# Patient Record
Sex: Female | Born: 1985 | Race: White | Hispanic: No | Marital: Married | Smoking: Never smoker
Health system: Southern US, Community
[De-identification: ages and names within clinical notes are randomized; demographics above are authoritative.]

## PROBLEM LIST (undated history)

## (undated) DIAGNOSIS — L509 Urticaria, unspecified: Secondary | ICD-10-CM

## (undated) DIAGNOSIS — J302 Other seasonal allergic rhinitis: Secondary | ICD-10-CM

## (undated) DIAGNOSIS — R51 Headache: Secondary | ICD-10-CM

## (undated) DIAGNOSIS — R519 Headache, unspecified: Secondary | ICD-10-CM

## (undated) DIAGNOSIS — Z8619 Personal history of other infectious and parasitic diseases: Secondary | ICD-10-CM

## (undated) DIAGNOSIS — T7840XA Allergy, unspecified, initial encounter: Secondary | ICD-10-CM

## (undated) DIAGNOSIS — Z9289 Personal history of other medical treatment: Secondary | ICD-10-CM

## (undated) DIAGNOSIS — N926 Irregular menstruation, unspecified: Secondary | ICD-10-CM

## (undated) DIAGNOSIS — N390 Urinary tract infection, site not specified: Secondary | ICD-10-CM

## (undated) DIAGNOSIS — L309 Dermatitis, unspecified: Secondary | ICD-10-CM

## (undated) DIAGNOSIS — R55 Syncope and collapse: Secondary | ICD-10-CM

## (undated) DIAGNOSIS — R002 Palpitations: Secondary | ICD-10-CM

## (undated) DIAGNOSIS — G43909 Migraine, unspecified, not intractable, without status migrainosus: Secondary | ICD-10-CM

## (undated) HISTORY — DX: Migraine, unspecified, not intractable, without status migrainosus: G43.909

## (undated) HISTORY — DX: Urinary tract infection, site not specified: N39.0

## (undated) HISTORY — DX: Syncope and collapse: R55

## (undated) HISTORY — DX: Personal history of other medical treatment: Z92.89

## (undated) HISTORY — DX: Other seasonal allergic rhinitis: J30.2

## (undated) HISTORY — DX: Dermatitis, unspecified: L30.9

## (undated) HISTORY — DX: Headache: R51

## (undated) HISTORY — DX: Headache, unspecified: R51.9

## (undated) HISTORY — DX: Irregular menstruation, unspecified: N92.6

## (undated) HISTORY — DX: Personal history of other infectious and parasitic diseases: Z86.19

## (undated) HISTORY — PX: TYMPANOSTOMY TUBE PLACEMENT: SHX32

## (undated) HISTORY — DX: Allergy, unspecified, initial encounter: T78.40XA

## (undated) HISTORY — DX: Urticaria, unspecified: L50.9

## (undated) HISTORY — PX: TONSILECTOMY, ADENOIDECTOMY, BILATERAL MYRINGOTOMY AND TUBES: SHX2538

## (undated) HISTORY — DX: Palpitations: R00.2

## (undated) HISTORY — PX: ADENOIDECTOMY: SUR15

---
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
## 2008-03-03 ENCOUNTER — Other Ambulatory Visit: Payer: Self-pay

## 2008-03-03 ENCOUNTER — Emergency Department: Payer: Self-pay | Admitting: Unknown Physician Specialty

## 2008-03-13 ENCOUNTER — Ambulatory Visit: Payer: Self-pay | Admitting: Otolaryngology

## 2008-08-25 ENCOUNTER — Ambulatory Visit: Payer: Self-pay | Admitting: Otolaryngology

## 2008-10-21 ENCOUNTER — Ambulatory Visit: Payer: Self-pay | Admitting: Family

## 2008-12-02 ENCOUNTER — Ambulatory Visit: Payer: Self-pay | Admitting: Gastroenterology

## 2010-09-02 LAB — HM HIV SCREENING LAB: HM HIV Screening: NEGATIVE

## 2010-09-03 LAB — CBC AND DIFFERENTIAL
HCT: 33 — AB (ref 36–46)
HEMOGLOBIN: 10.8 — AB (ref 12.0–16.0)
NEUTROS ABS: 7
PLATELETS: 229 (ref 150–399)
WBC: 9.3

## 2010-11-14 ENCOUNTER — Observation Stay: Payer: Self-pay

## 2010-11-15 ENCOUNTER — Inpatient Hospital Stay: Payer: Self-pay | Admitting: Obstetrics & Gynecology

## 2013-05-28 ENCOUNTER — Encounter: Payer: Self-pay | Admitting: Internal Medicine

## 2013-05-28 ENCOUNTER — Ambulatory Visit (INDEPENDENT_AMBULATORY_CARE_PROVIDER_SITE_OTHER): Payer: BC Managed Care – PPO | Admitting: Internal Medicine

## 2013-05-28 VITALS — BP 108/74 | HR 75 | Temp 98.6°F | Resp 14 | Ht 64.14 in | Wt 172.5 lb

## 2013-05-28 DIAGNOSIS — R002 Palpitations: Secondary | ICD-10-CM

## 2013-05-28 DIAGNOSIS — R079 Chest pain, unspecified: Secondary | ICD-10-CM | POA: Insufficient documentation

## 2013-05-28 DIAGNOSIS — Z23 Encounter for immunization: Secondary | ICD-10-CM

## 2013-05-28 DIAGNOSIS — R0600 Dyspnea, unspecified: Secondary | ICD-10-CM

## 2013-05-28 DIAGNOSIS — R0609 Other forms of dyspnea: Secondary | ICD-10-CM

## 2013-05-28 LAB — BASIC METABOLIC PANEL
BUN: 11 mg/dL (ref 6–23)
CO2: 25 mEq/L (ref 19–32)
Calcium: 9.4 mg/dL (ref 8.4–10.5)
Chloride: 107 mEq/L (ref 96–112)
GFR: 119.7 mL/min (ref >60.00)
Glucose, Bld: 93 mg/dL (ref 70–99)
Potassium: 4.3 mEq/L (ref 3.5–5.1)
Sodium: 141 mEq/L (ref 135–145)

## 2013-05-28 LAB — HEPATIC FUNCTION PANEL
ALT: 14 U/L (ref 0–35)
AST: 16 U/L (ref 0–37)
Albumin: 4.4 g/dL (ref 3.5–5.2)
Alkaline Phosphatase: 41 U/L (ref 39–117)
Bilirubin, Direct: 0.1 mg/dL (ref 0.0–0.3)
Total Protein: 7.7 g/dL (ref 6.0–8.3)

## 2013-05-28 LAB — LDL CHOLESTEROL, DIRECT: Direct LDL: 95.4 mg/dL

## 2013-05-28 MED ORDER — ESOMEPRAZOLE MAGNESIUM 20 MG PO CPDR: 20.0000 mg | DELAYED_RELEASE_CAPSULE | Freq: Every day | ORAL | Status: DC

## 2013-05-28 NOTE — Progress Notes (Signed)
Patient ID: Brooke Mclaughlin, female   DOB: 24-Jan-1986, 27 y.o.   MRN: [id number]  Patient Active Problem List   Diagnosis Date Noted  . Chest pain 05/28/2013    Subjective:  CC:   Chief Complaint  Patient presents with  . Establish Care    HPI:   Brooke Mclaughlin is a 27 y.o. female who presents as a new patient to establish primary care with the chief complaint of  Recurrent chest pain.  Most recent epiosde lasted an entire week and occurred a month ago ,  Felt constant,  Didn't feel like reflux"I've had that before"  Had an episode of left arm tingling for a few hours during the last episode,  And had an episode of nausea that lasted a few hours. Sometimes the episodes start with a headache, followed by chest pain and palpitations. 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  Always at rest or doing nonexertional activities. Gets diffuse pain across the chest wall,  Then gets Short of breath, describes it as an uncomfortable pressure and heaviness.  Accompanied  By palpitations. Which have been occurring for years.  first episode occurred 1  To 2 years ago,  No prior evaluation but has seen a cardiologist in the remote past for dizziness, at [facility] at age 41.  A stress test was done (treadmill) but she had no recurrence of symptoms while on it.  Marland Kitchenalso saw ENT for dizziness but no cause was found She has  been on birth control since the birth of her son , does not smoke and has no personal or family history of thrombotic disorders but there is a strong maternal history of CAD;  reportedly her MGM had an AMI in her 41's andher maternal aunt has also had an MI.  MGM is currently living with a defibrillator.    Referred by mother Brooke Mclaughlin.   Has had reflux but states that it occurs rarely and did not feel at all like the pain she is describing  Occasional migraines, occur once a month .   Starts with an aura folowed by headache and nausea.  One time had complete visual loss occurred while driving  No history of hospitalization .,  Vaginal delivery 2 years ago, no complications of pregnancy or delivert  History of syncope at age 41 while at work.   Lost 30 lbs post partum . Eats 6 small meals daily.  Drinks diet sodas and water .  No history of snoring,   No tobacco or illicits.  Moderate consumption of beer., mostly on the weekends. Lives alone with her  2 yr old son.  No history of domestic violence.      Past Medical History  Diagnosis Date  . Syncope 2003    has felt that way since but did not faint  . Migraines   . Frequent headaches   . UTI (lower urinary tract infection)     Past Surgical History  Procedure Laterality Date  . Tonsilectomy, adenoidectomy, bilateral myringotomy and tubes Bilateral     Family History  Problem Relation Age of Onset  . Hypertension Mother   . Hypertension Maternal Grandmother   . Heart disease Maternal Grandmother     History   Social History  . Marital Status: Single    Spouse Name: N/A    Number of Children: N/A  . Years of Education: N/A  Occupational History  . Not on file.   Social History Main Topics  . Smoking status: Never Smoker   . Smokeless tobacco: Never Used  . Alcohol Use: Yes  . Drug Use: No  . Sexual Activity: Yes     Comment: Not at the moment   Other Topics Concern  . Not on file   Social History Narrative  . No narrative on file   No Known Allergies   Review of Systems:   The remainder of the review of systems was negative except those addressed in the HPI.    Objective:  BP 108/74  Pulse 75  Temp(Src) 98.6 F (37 C) (Oral)  Resp 14  Ht 5' 4.14" (1.629 m)  Wt 172 lb 8 oz (78.245 kg)  BMI 29.49 kg/m2  SpO2 99%  LMP 03/04/2013  General appearance: alert, cooperative and appears stated age Ears: normal TM's and external ear canals both ears Throat: lips, mucosa, and  tongue normal; teeth and gums normal Neck: no adenopathy, no carotid bruit, supple, symmetrical, trachea midline and thyroid not enlarged, symmetric, no tenderness/mass/nodules Back: symmetric, no curvature. ROM normal. No CVA tenderness. Lungs: clear to auscultation bilaterally Heart: regular rate and rhythm, S1, S2 normal, no murmur, click, rub or gallop Abdomen: soft, non-tender; bowel sounds normal; no masses,  no organomegaly Pulses: 2+ and symmetric Skin: Skin color, texture, turgor normal. No rashes or lesions Lymph nodes: Cervical, supraclavicular, and axillary nodes normal.  Assessment and Plan:  Chest pain Etiology unclear,  Her pain is rcurrent, atypical with last episode lasting a  week  accompanied by palpitations and dyspnea. Prior cardiac workup for dizziness 4 years ago included a stress test which was normal Gavin Potters, presumed).  Given her history of birth control use (Implanon) I have checked a D Dmer which was normal at 0.27 so a PR is unlikely.  Baseline EKG is done and WNL.    cardiology referral to Dossie Arbour for Holter monitor and ECHO.  Will treat empirically for esohagitis /reflux in the interim with a PPI.    Updated Medication List Outpatient Encounter Prescriptions as of 05/28/2013  Medication Sig Dispense Refill  . esomeprazole (NEXIUM) 20 MG capsule Take 1 capsule (20 mg total) by mouth daily before breakfast.  15 capsule  0  . Etonogestrel (IMPLANON Roosevelt) Inject into the skin.       No facility-administered encounter medications on file as of 05/28/2013.     Orders Placed This Encounter  Procedures  . Flu Vaccine QUAD 36+ mos PF IM (Fluarix)  . Basic metabolic panel  . Hepatic function panel  . LDL cholesterol, direct  . TSH  . Magnesium  . D-Dimer, Quantitative  . Ambulatory referral to Cardiology  . POCT urine pregnancy  . EKG 12-Lead    No Follow-up on file.

## 2013-05-28 NOTE — Assessment & Plan Note (Addendum)
Etiology unclear,  Her pain is rcurrent, atypical with last episode lasting a  week  accompanied by palpitations and dyspnea. Prior cardiac workup for dizziness 4 years ago included a stress test which was normal Brooke Mclaughlin, presumed).  Given her history of birth control use (Implanon) I have checked a D Dmer which was normal at 0.27 so a PR is unlikely.  Baseline EKG is done and WNL.    cardiology referral to Brooke Mclaughlin for Holter monitor and ECHO.  Will treat empirically for esohagitis /reflux in the interim with a PPI.

## 2013-05-28 NOTE — Patient Instructions (Addendum)
I am checking labs today that will help me decide if you need to have the CT of the chest   If the blood work is normal you will not need to have the Ct and we will send you to the cardiologist   In the meantime, I want you to take a Nexium tablet daily in the morning at least 20 minutes  before you eat. This treats reflux

## 2013-06-02 ENCOUNTER — Ambulatory Visit: Payer: BC Managed Care – PPO | Admitting: Cardiovascular Disease

## 2013-06-09 ENCOUNTER — Ambulatory Visit: Payer: BC Managed Care – PPO | Admitting: Cardiovascular Disease

## 2014-10-20 ENCOUNTER — Ambulatory Visit: Payer: Self-pay | Admitting: Nurse Practitioner

## 2014-10-21 ENCOUNTER — Ambulatory Visit (INDEPENDENT_AMBULATORY_CARE_PROVIDER_SITE_OTHER): Payer: BLUE CROSS/BLUE SHIELD | Admitting: Nurse Practitioner

## 2014-10-21 ENCOUNTER — Encounter: Payer: Self-pay | Admitting: Nurse Practitioner

## 2014-10-21 VITALS — BP 124/78 | HR 82 | Temp 98.2°F | Resp 12 | Ht 64.0 in | Wt 191.4 lb

## 2014-10-21 DIAGNOSIS — R2242 Localized swelling, mass and lump, left lower limb: Secondary | ICD-10-CM

## 2014-10-21 DIAGNOSIS — H9201 Otalgia, right ear: Secondary | ICD-10-CM

## 2014-10-21 DIAGNOSIS — R21 Rash and other nonspecific skin eruption: Secondary | ICD-10-CM

## 2014-10-21 DIAGNOSIS — R222 Localized swelling, mass and lump, trunk: Secondary | ICD-10-CM

## 2014-10-21 NOTE — Assessment & Plan Note (Addendum)
Underneath scar she reports pain. Will get US of that area to check for further disturbances. May need to see Dermatologist again for check up of moles.

## 2014-10-21 NOTE — Progress Notes (Signed)
Subjective:    Patient ID: Brooke GlassmanAmy C Mclaughlin, female    DOB: 1986-03-08, 29 y.o.   MRN: [id number]  HPI  Brooke Mclaughlin is a 29 yo female with Multiple CC.   1) Lump on Left thigh- 1 cm, noticed couple years ago, tender when palpated, denies color changes, or growth   2) Pain lower back, mole removed x 8 years ago. Reports it was pre-cancerous. It was removed by ENT. Tanning beds years ago when teenager, uses sunscreen most of the time.   3) Rash- right lower forearm, burns, x 2 weeks, not tried anything.   4) Right ear pain x 1 day and sore throat x 1 week. Yesterday, improved today. Reports allergies.   Review of Systems  Constitutional: Negative for fever, chills, diaphoresis and fatigue.  HENT: Positive for ear pain.   Respiratory: Negative for chest tightness, shortness of breath and wheezing.   Cardiovascular: Negative for chest pain, palpitations and leg swelling.  Gastrointestinal: Negative for nausea, vomiting and diarrhea.  Musculoskeletal: Positive for back pain.  Skin: Positive for rash.  Neurological: Negative for dizziness, weakness, numbness and headaches.  Psychiatric/Behavioral: The patient is not nervous/anxious.    Past Medical History  Diagnosis Date  . Syncope 2003    has felt that way since but did not faint  . Migraines   . Frequent headaches   . UTI (lower urinary tract infection)     History   Social History  . Marital Status: Single    Spouse Name: N/A  . Number of Children: N/A  . Years of Education: N/A   Occupational History  . Not on file.   Social History Main Topics  . Smoking status: Never Smoker   . Smokeless tobacco: Never Used  . Alcohol Use: Yes  . Drug Use: No  . Sexual Activity: Yes     Comment: Not at the moment   Other Topics Concern  . Not on file   Social History Narrative    Past Surgical History  Procedure Laterality Date  . Tonsilectomy, adenoidectomy, bilateral myringotomy and tubes Bilateral     Family  History  Problem Relation Age of Onset  . Hypertension Mother   . Hypertension Maternal Grandmother   . Heart disease Maternal Grandmother     No Known Allergies  Current Outpatient Prescriptions on File Prior to Visit  Medication Sig Dispense Refill  . Etonogestrel (IMPLANON [place]ou[place]) Inject into the skin.     No current facility-administered medications on file prior to visit.      Objective:   Physical Exam  Constitutional: She is oriented to person, place, and time. She appears well-developed and well-nourished. No distress.  BP 124/78 mmHg  Pulse 82  Temp(Src) 98.2 F (36.8 C) (Oral)  Resp 12  Ht 5\' 4"  (1.626 m)  Wt 191 lb 6.4 oz (86.818 kg)  BMI 32.84 kg/m2  SpO2 97%  LMP  (Exact Date)   HENT:  Head: Normocephalic and atraumatic.  Right Ear: External ear normal.  Left Ear: External ear normal.  TM's Clear bilaterally. Red area that is irritated Cymba of the right ear- looks like there are several black heads and may be clogged.   Cardiovascular: Normal rate, regular rhythm, normal heart sounds and intact distal pulses.  Exam reveals no gallop and no friction rub.   No murmur heard. Pulmonary/Chest: Effort normal and breath sounds normal. No respiratory distress. She has no wheezes. She has no rales. She exhibits no tenderness.  Neurological:  She is alert and oriented to person, place, and time. No cranial nerve deficit. She exhibits normal muscle tone. Coordination normal.  Skin: Skin is warm and dry. No rash noted. She is not diaphoretic.     Psychiatric: She has a normal mood and affect. Her behavior is normal. Judgment and thought content normal.       Assessment & Plan:

## 2014-10-21 NOTE — Progress Notes (Signed)
Pre visit review using our clinic review tool, if applicable. No additional management support is needed unless otherwise documented below in the visit note. 

## 2014-10-21 NOTE — Patient Instructions (Addendum)
We will contact you about your referral for ultrasound.   Over the counter cortisone cream for rash, keep open to air  It is recommended to have yearly checks of your moles by a dermatologist. Remember to use sunscreen when outdoors (sunny or overcast) and hats or clothing for further protection.   Alcohol on a swab for outer ear pain.   Keep an eye on your sore throat.   Call us is anything changes or worsens.

## 2014-10-24 DIAGNOSIS — H9201 Otalgia, right ear: Secondary | ICD-10-CM | POA: Insufficient documentation

## 2014-10-24 DIAGNOSIS — R2240 Localized swelling, mass and lump, unspecified lower limb: Secondary | ICD-10-CM | POA: Insufficient documentation

## 2014-10-24 NOTE — Assessment & Plan Note (Signed)
Can not see a rash, feels like dry skin. Asked her to try OTC cortisone if itching. Will follow.

## 2014-10-24 NOTE — Assessment & Plan Note (Signed)
Ear pain is external. Asked pt to try dipping q-tip in alcohol and cleaning the external ear where there are several comedones.

## 2014-10-24 NOTE — Assessment & Plan Note (Signed)
US of thigh lump since it is tender and rubbery

## 2014-10-27 ENCOUNTER — Ambulatory Visit: Payer: Self-pay | Admitting: Nurse Practitioner

## 2014-11-24 ENCOUNTER — Telehealth: Payer: Self-pay | Admitting: Internal Medicine

## 2014-11-24 NOTE — Telephone Encounter (Signed)
US requested.  Will be faxed asap.

## 2014-11-24 NOTE — Telephone Encounter (Signed)
Patient would like the results of the ultrasound that she took.  Please call her at work [phone] Ext. [phone].

## 2014-11-24 NOTE — Telephone Encounter (Signed)
Please request results if we do not have.

## 2014-11-24 NOTE — Telephone Encounter (Signed)
ordered by Lyla Sonarrie,  Please check with Lyla Sonarrie

## 2014-11-25 ENCOUNTER — Telehealth: Payer: Self-pay

## 2014-11-25 ENCOUNTER — Telehealth: Payer: Self-pay | Admitting: Internal Medicine

## 2014-11-25 NOTE — Telephone Encounter (Signed)
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

## 2014-11-25 NOTE — Telephone Encounter (Signed)
Ultrasound results read by Dr. Darrick Huntsmanullo.  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  Patient verbalized understanding and stated she was still having pain at the area of the scar.  I told patient if concerns remain a MRI can be obtained.  Encouraged pt to call back with any questions or concerns.

## 2014-11-26 NOTE — Telephone Encounter (Signed)
Notified patient.  Patient verbalized understanding and was encouraged to call back if there were further concerns wanted to proceed with a MRI.

## 2015-01-01 ENCOUNTER — Encounter: Payer: Self-pay | Admitting: Internal Medicine

## 2015-11-23 ENCOUNTER — Telehealth: Payer: Self-pay | Admitting: Internal Medicine

## 2015-11-23 MED ORDER — VALACYCLOVIR HCL 500 MG PO TABS: 500.0000 mg | ORAL_TABLET | Freq: Two times a day (BID) | ORAL | Status: DC

## 2015-11-23 NOTE — Telephone Encounter (Signed)
Yes, will send ,  [facility] since I have not seen her since 2014 and carrie has seen her since then.  She is no longer considered my patient

## 2015-11-23 NOTE — Telephone Encounter (Signed)
Thanks, I will next time! :)

## 2015-11-23 NOTE — Telephone Encounter (Signed)
Pt called wanting to know if Dr Darrick Huntsmanullo will write a Rx for pt, pt states she get a lot of cold sores on lip. Medication name is Zalacyclovir 500 mg. [facility]. Pharmacy [place] [id number] [place] - [address] Call pt @ [phone].  Pt states please leave message if she does not answer. Thank you!

## 2015-11-23 NOTE — Telephone Encounter (Signed)
Patient's last OV was Last march, please advise if you will write script. Thanks.

## 2015-12-17 ENCOUNTER — Other Ambulatory Visit: Payer: Self-pay | Admitting: Family Medicine

## 2015-12-17 ENCOUNTER — Telehealth: Payer: Self-pay | Admitting: Internal Medicine

## 2015-12-17 DIAGNOSIS — M25532 Pain in left wrist: Secondary | ICD-10-CM

## 2015-12-17 NOTE — Telephone Encounter (Signed)
Pt called in about left wrist it's been burning and throbbing pain cannot move it in certain directions. Pt fell Saturday on her wrist. Pt did not go to the urgent care. The night she fell on it she had tingling all the way up her arm no bruises. Pt called to get some direction on what to do. Call pt @ [phone]. I advised pt if she wants to make an appt she said she wants to wait to see what the nurse says. Thank you!

## 2015-12-19 NOTE — Telephone Encounter (Signed)
X-ray ordered.

## 2015-12-20 NOTE — Telephone Encounter (Signed)
FYI

## 2015-12-20 NOTE — Addendum Note (Signed)
Addended by: Sherlene ShamsULLO, Cherylynn Liszewski L on: 12/20/2015 11:49 AM   Modules accepted: Orders

## 2016-05-01 LAB — HM PAP SMEAR

## 2016-10-19 ENCOUNTER — Ambulatory Visit: Payer: BLUE CROSS/BLUE SHIELD | Admitting: Family Medicine

## 2017-01-24 ENCOUNTER — Ambulatory Visit (INDEPENDENT_AMBULATORY_CARE_PROVIDER_SITE_OTHER): Payer: BLUE CROSS/BLUE SHIELD | Admitting: Obstetrics & Gynecology

## 2017-01-24 ENCOUNTER — Encounter: Payer: Self-pay | Admitting: Obstetrics & Gynecology

## 2017-01-24 VITALS — BP 122/80 | HR 77 | Ht 64.0 in | Wt 209.0 lb

## 2017-01-24 DIAGNOSIS — Z3046 Encounter for surveillance of implantable subdermal contraceptive: Secondary | ICD-10-CM | POA: Diagnosis not present

## 2017-01-24 NOTE — Progress Notes (Signed)
   Contraception Counseling Patient presents for contraception counseling. The patient has no complaints today. The patient is sexually active. Pertinent past medical history: none.  Desires Nexplanon out.  No further BC desired at this time.  Wants to see how periods do without hormones.  OK if got pregnant.  PMHx: She  has a past medical history of Frequent headaches; Migraines; Syncope (2003); and UTI (lower urinary tract infection). Also,  has a past surgical history that includes Tonsilectomy, adenoidectomy, bilateral myringotomy and tubes (Bilateral)., family history includes Heart disease in her maternal grandmother; Hypertension in her maternal grandmother and mother.,  reports that she has never smoked. She has never used smokeless tobacco. She reports that she drinks alcohol. She reports that she does not use drugs.  She has a current medication list which includes the following prescription(s): valacyclovir. Also, has No Known Allergies.  Review of Systems  Constitutional: Negative for chills, fever and malaise/fatigue.  HENT: Negative for congestion, sinus pain and sore throat.   Eyes: Negative for blurred vision and pain.  Respiratory: Negative for cough and wheezing.   Cardiovascular: Negative for chest pain and leg swelling.  Gastrointestinal: Negative for abdominal pain, constipation, diarrhea, heartburn, nausea and vomiting.  Genitourinary: Negative for dysuria, frequency, hematuria and urgency.  Musculoskeletal: Negative for back pain, joint pain, myalgias and neck pain.  Skin: Negative for itching and rash.  Neurological: Negative for dizziness, tremors and weakness.  Endo/Heme/Allergies: Does not bruise/bleed easily.  Psychiatric/Behavioral: Negative for depression. The patient is not nervous/anxious and does not have insomnia.     Objective: BP 122/80   Pulse 77   Ht 5\' 4"  (1.626 m)   Wt 209 lb (94.8 kg)   LMP 12/30/2016   BMI 35.87 kg/m  Physical Exam    Constitutional: She is oriented to person, place, and time. She appears well-developed and well-nourished. No distress.  Musculoskeletal: Normal range of motion.  Neurological: She is alert and oriented to person, place, and time.  Skin: Skin is warm and dry.  Psychiatric: She has a normal mood and affect.  Vitals reviewed.   ASSESSMENT/PLAN:    Problem List Items Addressed This Visit      Other   Encounter for surveillance of implantable subdermal contraceptive - Primary      Nexplanon removal Procedure note - The Nexplanon was noted in the patient's arm and the end was identified. The skin was cleansed with a Betadine solution. A small injection of subcutaneous lidocaine with epinephrine was given over the end of the implant. An incision was made at the end of the implant. The rod was noted in the incision and grasped with a hemostat. It was noted to be intact.  Steri-Strip was placed approximating the incision. Hemostasis was noted.  Letitia Libraobert Paul Zeynep Fantroy ,MD 01/24/2017,3:40 PM

## 2017-03-06 ENCOUNTER — Ambulatory Visit (INDEPENDENT_AMBULATORY_CARE_PROVIDER_SITE_OTHER): Payer: BLUE CROSS/BLUE SHIELD | Admitting: Obstetrics and Gynecology

## 2017-03-06 ENCOUNTER — Encounter: Payer: Self-pay | Admitting: Obstetrics and Gynecology

## 2017-03-06 ENCOUNTER — Other Ambulatory Visit (INDEPENDENT_AMBULATORY_CARE_PROVIDER_SITE_OTHER): Payer: BLUE CROSS/BLUE SHIELD

## 2017-03-06 VITALS — BP 130/82 | HR 94 | Ht 64.0 in | Wt 213.0 lb

## 2017-03-06 DIAGNOSIS — R1032 Left lower quadrant pain: Secondary | ICD-10-CM

## 2017-03-06 DIAGNOSIS — N83201 Unspecified ovarian cyst, right side: Secondary | ICD-10-CM | POA: Diagnosis not present

## 2017-03-06 NOTE — Progress Notes (Signed)
Chief Complaint  Patient presents with  . Pelvic Pain    radiates to back x 1 week/ Nexplanon removed in June    HPI:      Ms. Brooke Mclaughlin is a 31 y.o. G1P1001 who LMP was Patient's last menstrual period was 01/30/2017., presents today for LLQ pain for the past wk. Sx are sharp, dull, achy and intermittent, 7/10 at their worst. Pain radiates to LT LBP and LT flank. She has tried ibup without relief. She went to urgent care last wk and had WBCs in her urine and was given abx, without any pain releif. She didn't feel like she had a UTI. No vag sx, no GI sx except nausea with the pain, no fevers. She has a hx of ovar cysts in the past. She had nexplanon removed 6/18 and conception is ok. She has not had her period since nexplanon rem but had neg UPT at urgent care last wk.  She is sex active, no new sex partners.    Patient Active Problem List   Diagnosis Date Noted  . Right ovarian cyst 03/06/2017  . Encounter for surveillance of implantable subdermal contraceptive 01/24/2017  . Lump of thigh 10/24/2014  . Right ear pain 10/24/2014  . Rash and nonspecific skin eruption 10/21/2014  . Lump of skin of back 10/21/2014  . Chest pain 05/28/2013    Family History  Problem Relation Age of Onset  . Hypertension Mother        RUNS ON MAT SIDE OF FAMILY  . Migraines Father   . Hypertension Maternal Grandmother   . Heart disease Maternal Grandmother   . Heart disease Maternal Aunt   . Heart disease Maternal Uncle     Social History   Social History  . Marital status: Single    Spouse name: N/A  . Number of children: 1  . Years of education: 30   Occupational History  . ENGINEER     [employer]   Social History Main Topics  . Smoking status: Never Smoker  . Smokeless tobacco: Never Used  . Alcohol use Yes     Comment: OCC  . Drug use: No  . Sexual activity: Yes     Comment: Not at the moment   Other Topics Concern  . Not on file   Social History Narrative  . No  narrative on file     Current Outpatient Prescriptions:  .  valACYclovir (VALTREX) 500 MG tablet, Take 1 tablet (500 mg total) by mouth 2 (two) times daily., Disp: 30 tablet, Rfl: 3  Review of Systems  Constitutional: Negative for fever.  Gastrointestinal: Positive for nausea. Negative for blood in stool, constipation, diarrhea and vomiting.  Genitourinary: Positive for pelvic pain. Negative for dyspareunia, dysuria, flank pain, frequency, hematuria, urgency, vaginal bleeding, vaginal discharge and vaginal pain.  Musculoskeletal: Negative for back pain.  Skin: Negative for rash.     OBJECTIVE:   Vitals:  BP 130/82 (BP Location: Left Arm, Patient Position: Sitting, Cuff Size: Large)   Pulse 94   Ht 5\' 4"  (1.626 m)   Wt 213 lb (96.6 kg)   LMP 01/30/2017   BMI 36.56 kg/m   Physical Exam  Constitutional: She is oriented to person, place, and time and well-developed, well-nourished, and in no distress. Vital signs are normal.  Abdominal: There is tenderness in the left lower quadrant. There is no rigidity and no guarding.  Genitourinary: Vagina normal, cervix normal, right adnexa normal and vulva normal. Uterus is  tender. Uterus is not enlarged. Cervix exhibits no motion tenderness and no tenderness. Right adnexum displays no mass and no tenderness. Left adnexum displays tenderness. Left adnexum displays no mass. Vulva exhibits no erythema, no exudate, no lesion, no rash and no tenderness. Vagina exhibits no lesion.  Neurological: She is oriented to person, place, and time.  Vitals reviewed.   Results: GYN U/S-->EM=2.78 MM; LTO WNL; RTO WITH 5.8 CM X 3.3 CM SIMPLE CYST; NO FF IN CDS  Assessment/Plan: LLQ pain - RTO cyst on u/s. Question deferred pain.  - Plan: US Transvaginal Non-OB  Right ovarian cyst - 5.8 x 3.3 cm RTO cyst. Rechk u/s in 6 wks. NSAIDs. F/u sooner prn.  - Plan: US Transvaginal Non-OB   Return in about 6 weeks (around 04/17/2017) for GYN u/s for RTO cyst f/u--[facility]  to call pt.  Brooke Needs B. Damya Comley, PA-C 03/06/2017 1:55 PM

## 2017-03-26 ENCOUNTER — Encounter: Payer: Self-pay | Admitting: Obstetrics & Gynecology

## 2017-03-26 MED ORDER — NORETHIN-ETH ESTRAD-FE BIPHAS 1 MG-10 MCG / 10 MCG PO TABS: 1.0000 | ORAL_TABLET | Freq: Every day | ORAL | 3 refills | Status: DC

## 2017-03-26 NOTE — Telephone Encounter (Signed)
ERx done for OCP

## 2017-04-17 ENCOUNTER — Ambulatory Visit (INDEPENDENT_AMBULATORY_CARE_PROVIDER_SITE_OTHER): Payer: BLUE CROSS/BLUE SHIELD

## 2017-04-17 ENCOUNTER — Telehealth: Payer: Self-pay | Admitting: Obstetrics and Gynecology

## 2017-04-17 DIAGNOSIS — N83201 Unspecified ovarian cyst, right side: Secondary | ICD-10-CM | POA: Diagnosis not present

## 2017-04-17 NOTE — Telephone Encounter (Signed)
Pt aware of neg u/s, resolved RTO cyst. Pt's LLQ pain sx resolved. F/u prn.

## 2017-06-20 ENCOUNTER — Encounter: Payer: Self-pay | Admitting: Family Medicine

## 2017-06-20 ENCOUNTER — Ambulatory Visit (INDEPENDENT_AMBULATORY_CARE_PROVIDER_SITE_OTHER): Payer: BLUE CROSS/BLUE SHIELD | Admitting: Family Medicine

## 2017-06-20 VITALS — BP 118/80 | HR 73 | Temp 98.0°F | Wt 209.5 lb

## 2017-06-20 DIAGNOSIS — G4452 New daily persistent headache (NDPH): Secondary | ICD-10-CM

## 2017-06-20 DIAGNOSIS — E559 Vitamin D deficiency, unspecified: Secondary | ICD-10-CM

## 2017-06-20 LAB — COMPREHENSIVE METABOLIC PANEL
ALBUMIN: 4 g/dL (ref 3.5–5.2)
ALK PHOS: 43 U/L (ref 39–117)
ALT: 12 U/L (ref 0–35)
AST: 12 U/L (ref 0–37)
BILIRUBIN TOTAL: 0.5 mg/dL (ref 0.2–1.2)
BUN: 12 mg/dL (ref 6–23)
CALCIUM: 9.2 mg/dL (ref 8.4–10.5)
CO2: 24 mEq/L (ref 19–32)
Chloride: 108 mEq/L (ref 96–112)
Creatinine, Ser: 0.69 mg/dL (ref 0.40–1.20)
GFR: 104.84 mL/min (ref >60.00)
Glucose, Bld: 96 mg/dL (ref 70–99)
Potassium: 4.2 mEq/L (ref 3.5–5.1)
Sodium: 138 mEq/L (ref 135–145)
TOTAL PROTEIN: 7.1 g/dL (ref 6.0–8.3)

## 2017-06-20 LAB — CBC
HEMATOCRIT: 40.9 % (ref 36.0–46.0)
HEMOGLOBIN: 13.6 g/dL (ref 12.0–15.0)
MCHC: 33.3 g/dL (ref 30.0–36.0)
MCV: 93.3 fl (ref 78.0–100.0)
Platelets: 267 10*3/uL (ref 150.0–400.0)
RBC: 4.39 Mil/uL (ref 3.87–5.11)
RDW: 12.6 % (ref 11.5–15.5)
WBC: 5.1 10*3/uL (ref 4.0–10.5)

## 2017-06-20 LAB — VITAMIN D 25 HYDROXY (VIT D DEFICIENCY, FRACTURES): VITD: 20.8 ng/mL — ABNORMAL LOW (ref 30.00–100.00)

## 2017-06-20 LAB — TSH: TSH: 1.49 u[IU]/mL (ref 0.35–4.50)

## 2017-06-20 LAB — MAGNESIUM: MAGNESIUM: 2.1 mg/dL (ref 1.5–2.5)

## 2017-06-20 LAB — VITAMIN B12: VITAMIN B 12: 128 pg/mL — AB (ref 211–911)

## 2017-06-20 MED ORDER — SUMATRIPTAN SUCCINATE 50 MG PO TABS: ORAL_TABLET | ORAL | 0 refills | Status: DC

## 2017-06-20 MED ORDER — KETOROLAC TROMETHAMINE 60 MG/2ML IM SOLN
60.0000 mg | Freq: Once | INTRAMUSCULAR | Status: AC
  Administered 2017-06-20: 60 mg via INTRAMUSCULAR

## 2017-06-20 MED ORDER — DICLOFENAC SODIUM 75 MG PO TBEC: 75.0000 mg | DELAYED_RELEASE_TABLET | Freq: Two times a day (BID) | ORAL | 0 refills | Status: DC

## 2017-06-20 NOTE — Patient Instructions (Signed)
I have sent in two medications to your pharmacy If not better with injection in office, you can take sumatriptan If using diclofenac, do not take other NSAIDs like ibuprofen, advil, alleve If not better in a couple of days, please let me know  Migraine Headache A migraine headache is an intense, throbbing pain on one side or both sides of the head. Migraines may also cause other symptoms, such as nausea, vomiting, and sensitivity to light and noise. What are the causes? Doing or taking certain things may also trigger migraines, such as:  Alcohol.  Smoking.  Medicines, such as: ? Medicine used to treat chest pain (nitroglycerine). ? Birth control pills. ? Estrogen pills. ? Certain blood pressure medicines.  Aged cheeses, chocolate, or caffeine.  Foods or drinks that contain nitrates, glutamate, aspartame, or tyramine.  Physical activity.  Other things that may trigger a migraine include:  Menstruation.  Pregnancy.  Hunger.  Stress, lack of sleep, too much sleep, or fatigue.  Weather changes.  What increases the risk? The following factors may make you more likely to experience migraine headaches:  Age. Risk increases with age.  Family history of migraine headaches.  Being Caucasian.  Depression and anxiety.  Obesity.  Being a woman.  Having a hole in the heart (patent foramen ovale) or other heart problems.  What are the signs or symptoms? The main symptom of this condition is pulsating or throbbing pain. Pain may:  Happen in any area of the head, such as on one side or both sides.  Interfere with daily activities.  Get worse with physical activity.  Get worse with exposure to bright lights or loud noises.  Other symptoms may include:  Nausea.  Vomiting.  Dizziness.  General sensitivity to bright lights, loud noises, or smells.  Before you get a migraine, you may get warning signs that a migraine is developing (aura). An aura may  include:  Seeing flashing lights or having blind spots.  Seeing bright spots, halos, or zigzag lines.  Having tunnel vision or blurred vision.  Having numbness or a tingling feeling.  Having trouble talking.  Having muscle weakness.  How is this diagnosed? A migraine headache can be diagnosed based on:  Your symptoms.  A physical exam.  Tests, such as CT scan or MRI of the head. These imaging tests can help rule out other causes of headaches.  Taking fluid from the spine (lumbar puncture) and analyzing it (cerebrospinal fluid analysis, or CSF analysis).  How is this treated? A migraine headache is usually treated with medicines that:  Relieve pain.  Relieve nausea.  Prevent migraines from coming back.  Treatment may also include:  Acupuncture.  Lifestyle changes like avoiding foods that trigger migraines.  Follow these instructions at home: Medicines  Take over-the-counter and prescription medicines only as told by your health care provider.  Do not drive or use heavy machinery while taking prescription pain medicine.  To prevent or treat constipation while you are taking prescription pain medicine, your health care provider may recommend that you: ? Drink enough fluid to keep your urine clear or pale yellow. ? Take over-the-counter or prescription medicines. ? Eat foods that are high in fiber, such as fresh fruits and vegetables, whole grains, and beans. ? Limit foods that are high in fat and processed sugars, such as fried and sweet foods. Lifestyle  Avoid alcohol use.  Do not use any products that contain nicotine or tobacco, such as cigarettes and e-cigarettes. If you need help  quitting, ask your health care provider.  Get at least 8 hours of sleep every night.  Limit your stress. General instructions   Keep a journal to find out what may trigger your migraine headaches. For example, write down: ? What you eat and drink. ? How much sleep you  get. ? Any change to your diet or medicines.  If you have a migraine: ? Avoid things that make your symptoms worse, such as bright lights. ? It may help to lie down in a dark, quiet room. ? Do not drive or use heavy machinery. ? Ask your health care provider what activities are safe for you while you are experiencing symptoms.  Keep all follow-up visits as told by your health care provider. This is important. Contact a health care provider if:  You develop symptoms that are different or more severe than your usual migraine symptoms. Get help right away if:  Your migraine becomes severe.  You have a fever.  You have a stiff neck.  You have vision loss.  Your muscles feel weak or like you cannot control them.  You start to lose your balance often.  You develop trouble walking.  You faint. This information is not intended to replace advice given to you by your health care provider. Make sure you discuss any questions you have with your health care provider. Document Released: 08/07/2005 Document Revised: 02/25/2016 Document Reviewed: 01/24/2016 Elsevier Interactive Patient Education  2017 [employer].

## 2017-06-20 NOTE — Progress Notes (Signed)
Subjective:    Patient ID: Brooke GlassmanAmy C Mclaughlin, female    DOB: 1985/10/21, 31 y.o.   MRN: [id number]  HPI This is a 31 yo female who presents today with headaches x 1.5 weeks. Headaches are pounding. Pain all the time. Worse as day goes on. Some nausea, no aura. Some light sensitivity, no sound sensitivity. Has tried ibuprofen, Excedrin, ASA, tylenol. Pain so bad she is crying daily. Has been having 1 cafeinated drink per day.  Has been waking her in the middle of the night. Worse with walking around. Has year round allergies, no cold symptoms. No fever. No blurred vision or double vision.  No increased stress when headaches started, but has had recent court dates. Some sleep disturbance, wakes up and can't go back to sleep. No new medicines, no new work/daily activities.   History of low vitamin D, took supplementation for awhile then stopped.   Past Medical History:  Diagnosis Date  . Frequent headaches   . History of Papanicolaou smear of cervix 12/31/13; 05/01/16   NEG; -/-  . Irregular menses   . Migraines   . Palpitations   . Syncope 2003   has felt that way since but did not faint  . UTI (lower urinary tract infection)    Past Surgical History:  Procedure Laterality Date  . TONSILECTOMY, ADENOIDECTOMY, BILATERAL MYRINGOTOMY AND TUBES Bilateral    Family History  Problem Relation Age of Onset  . Hypertension Mother        RUNS ON MAT SIDE OF FAMILY  . Migraines Father   . Hypertension Maternal Grandmother   . Heart disease Maternal Grandmother   . Heart disease Maternal Aunt   . Heart disease Maternal Uncle    Social History  Substance Use Topics  . Smoking status: Never Smoker  . Smokeless tobacco: Never Used  . Alcohol use Yes     Comment: OCC      Review of Systems Per HPI    Objective:   Physical Exam  Constitutional: She is oriented to person, place, and time. She appears well-developed and well-nourished. No distress.  HENT:  Head: Normocephalic and  atraumatic.  Mouth/Throat: Oropharynx is clear and moist.  Eyes: Pupils are equal, round, and reactive to light. Conjunctivae and EOM are normal. Right eye exhibits no discharge. Left eye exhibits no discharge. No scleral icterus.  Neck: Normal range of motion. Neck supple.  Cardiovascular: Normal rate, regular rhythm and normal heart sounds.   Pulmonary/Chest: Effort normal and breath sounds normal.  Musculoskeletal: Normal range of motion. She exhibits no edema.  Lymphadenopathy:    She has no cervical adenopathy.  Neurological: She is alert and oriented to person, place, and time. She displays normal reflexes. No cranial nerve deficit.  Skin: Skin is warm and dry. She is not diaphoretic.  Psychiatric: She has a normal mood and affect. Her behavior is normal. Judgment and thought content normal.  Vitals reviewed.     BP 118/80 (BP Location: Right Arm, Patient Position: Sitting, Cuff Size: Large)   Pulse 73   Temp 98 F (36.7 C) (Oral)   Wt 209 lb 8 oz (95 kg)   LMP 05/29/2017   SpO2 98%   BMI 35.96 kg/m      Assessment & Plan:  1. New persistent daily headache - no worrisome findings on history or PE, possible migraine without aura vs tension headache - ketorolac (TORADOL) injection 60 mg; Inject 2 mLs (60 mg total) into the muscle once. -  SUMAtriptan (IMITREX) 50 MG tablet; May repeat in 2 hours once if headache persists or recurs.  Dispense: 10 tablet; Refill: 0 - diclofenac (VOLTAREN) 75 MG EC tablet; Take 1 tablet (75 mg total) by mouth 2 (two) times daily.  Dispense: 30 tablet; Refill: 0 - CBC - Comprehensive metabolic panel - TSH - Vitamin B12 - Magnesium - She will let me know if no improvement with above, will consider CT or if worsening symptoms  2. Vitamin D deficiency - VITAMIN D 25 Hydroxy (Vit-D Deficiency, Fractures)   Olean Ree, FNP-BC  [facility], [facility]  06/20/2017 8:39 AM

## 2017-06-21 ENCOUNTER — Encounter: Payer: Self-pay | Admitting: Family Medicine

## 2017-06-21 ENCOUNTER — Emergency Department (HOSPITAL_COMMUNITY): Payer: BLUE CROSS/BLUE SHIELD

## 2017-06-21 ENCOUNTER — Encounter: Payer: Self-pay | Admitting: Internal Medicine

## 2017-06-21 ENCOUNTER — Encounter (HOSPITAL_COMMUNITY): Payer: Self-pay

## 2017-06-21 ENCOUNTER — Emergency Department (HOSPITAL_COMMUNITY)
Admission: EM | Admit: 2017-06-21 | Discharge: 2017-06-21 | Disposition: A | Discharge status: Home / Self Care | Payer: BLUE CROSS/BLUE SHIELD | Attending: Emergency Medicine | Admitting: Emergency Medicine

## 2017-06-21 DIAGNOSIS — R51 Headache: Secondary | ICD-10-CM | POA: Diagnosis present

## 2017-06-21 DIAGNOSIS — R519 Headache, unspecified: Secondary | ICD-10-CM

## 2017-06-21 DIAGNOSIS — M542 Cervicalgia: Secondary | ICD-10-CM | POA: Diagnosis not present

## 2017-06-21 DIAGNOSIS — Z79899 Other long term (current) drug therapy: Secondary | ICD-10-CM | POA: Diagnosis not present

## 2017-06-21 DIAGNOSIS — H53149 Visual discomfort, unspecified: Secondary | ICD-10-CM | POA: Diagnosis not present

## 2017-06-21 DIAGNOSIS — G43901 Migraine, unspecified, not intractable, with status migrainosus: Secondary | ICD-10-CM

## 2017-06-21 LAB — CBC WITH DIFFERENTIAL/PLATELET
BASOS ABS: 0 10*3/uL (ref 0.0–0.1)
BASOS PCT: 1 %
Eosinophils Absolute: 0.1 10*3/uL (ref 0.0–0.7)
Eosinophils Relative: 1 %
HEMATOCRIT: 37.6 % (ref 36.0–46.0)
HEMOGLOBIN: 12.5 g/dL (ref 12.0–15.0)
Lymphocytes Relative: 35 %
Lymphs Abs: 2.3 10*3/uL (ref 0.7–4.0)
MCH: 30.3 pg (ref 26.0–34.0)
MCHC: 33.2 g/dL (ref 30.0–36.0)
MCV: 91.3 fL (ref 78.0–100.0)
Monocytes Absolute: 0.3 10*3/uL (ref 0.1–1.0)
Monocytes Relative: 4 %
NEUTROS ABS: 3.9 10*3/uL (ref 1.7–7.7)
NEUTROS PCT: 59 %
Platelets: 265 10*3/uL (ref 150–400)
RBC: 4.12 MIL/uL (ref 3.87–5.11)
RDW: 12.5 % (ref 11.5–15.5)
WBC: 6.6 10*3/uL (ref 4.0–10.5)

## 2017-06-21 LAB — I-STAT CHEM 8, ED
BUN: 4 mg/dL — ABNORMAL LOW (ref 6–20)
Calcium, Ion: 1.15 mmol/L (ref 1.15–1.40)
Chloride: 108 mmol/L (ref 101–111)
Creatinine, Ser: 0.6 mg/dL (ref 0.44–1.00)
Glucose, Bld: 98 mg/dL (ref 65–99)
HEMATOCRIT: 38 % (ref 36.0–46.0)
HEMOGLOBIN: 12.9 g/dL (ref 12.0–15.0)
Potassium: 3.8 mmol/L (ref 3.5–5.1)
SODIUM: 140 mmol/L (ref 135–145)
TCO2: 22 mmol/L (ref 22–32)

## 2017-06-21 MED ORDER — SODIUM CHLORIDE 0.9 % IV BOLUS (SEPSIS)
1000.0000 mL | Freq: Once | INTRAVENOUS | Status: AC
  Administered 2017-06-21: 1000 mL via INTRAVENOUS

## 2017-06-21 MED ORDER — METOCLOPRAMIDE HCL 5 MG/ML IJ SOLN
10.0000 mg | Freq: Once | INTRAMUSCULAR | Status: AC
  Administered 2017-06-21: 10 mg via INTRAVENOUS
  Filled 2017-06-21: qty 2

## 2017-06-21 MED ORDER — DIPHENHYDRAMINE HCL 50 MG/ML IJ SOLN
25.0000 mg | Freq: Once | INTRAMUSCULAR | Status: AC
  Administered 2017-06-21: 25 mg via INTRAVENOUS
  Filled 2017-06-21: qty 1

## 2017-06-21 NOTE — Discharge Instructions (Signed)
We saw you in the ER for headaches. All the labs and imaging are normal. We are not sure what is causing your headaches, however, there appears to be no evidence of infection, bleeds or tumors based on our exam and results. As discussed, headaches of this nature which are constant need to be evaluated by a neurologist.  There is a possibility that they might consider advanced imaging given the fact that you are on birth control pills  Please take motrin 600 and tylenol 650 mg round the clock for the next 6 hours. Take ibuprofen with food and antacid.  Please return to the ER if the headache gets severe and in not improving, you have associated new one sided numbness, tingling, weakness or confusion, seizures, poor balance or poor vision.

## 2017-06-21 NOTE — Telephone Encounter (Signed)
Headache x 2 weeks saw Dr[facility] Bard [facility]ess[facility] 06/20/17, given Toradol injection and  Imitrex and Voltaren, patient states pain still at 7 on 0-10 pain scale, with nausea but no emesis , denies any  other symptoms except she says her neck is stiff. [facility] not [facility] or to a UC if possible.

## 2017-06-21 NOTE — ED Triage Notes (Signed)
Pt reports headache x several weeks not relieved by ptc or prescription medications. Pt was seen at pcp yesterday and given Toradol injection and prescriptions for sumatriptan and diclofenac with no relief. Denies vision changes. Endorses nausea but no vomiting. Pt reports having a stiff neck intermittently since last Tuesday. Denies fevers.

## 2017-06-21 NOTE — ED Provider Notes (Signed)
[facility] EMERGENCY DEPARTMENT Provider Note   CSN: [id number] Arrival date & time: 06/21/17  1629     History   Chief Complaint Chief Complaint  Patient presents with  . Headache    HPI Brooke Mclaughlin is a 31 y.o. female.  HPI  Patient with history of frequent headaches and migraines comes in with chief complaint of severe headache. Patient reports that her current headache started 2 weeks ago and is constant.  Headache is diffuse, throbbing in nature, and sensitive to light.  However patient reports that her headache is not typical to her migraines.  Her migraine headache typically will last only for a few hours and there is a visual aura with the migraines.  Currently she has no aura.  Patient has taken several over-the-counter medicine including Excedrin, Tylenol, ibuprofen without any relief.  Patient saw her primary care doctor and was given a Toradol IM shot along with sumatriptan, neither of them have given him great relief.  Patient reports that the headache at its worst is 9 out of 10 and at its best is 6 out of 10.  Besides the light but she thinks that the headache is worse at nighttime.  Patient has associated nausea without any emesis.  Nausea is intermittent.  Patient denies any family history of brain aneurysm, brain tumors, brain bleeds, sudden unexpected death.  Patient does not have any history of cancer herself.  She does take oral contraceptives, but has no history of clotting.  Review of system is positive for intermittent episodes of neck pain and stiffness.  Patient denies any fevers, or trauma.  Past Medical History:  Diagnosis Date  . Frequent headaches   . History of Papanicolaou smear of cervix 12/31/13; 05/01/16   NEG; -/-  . Irregular menses   . Migraines   . Palpitations   . Syncope 2003   has felt that way since but did not faint  . UTI (lower urinary tract infection)     Patient Active Problem List   Diagnosis Date Noted    . Right ovarian cyst 03/06/2017  . Encounter for surveillance of implantable subdermal contraceptive 01/24/2017  . Lump of thigh 10/24/2014  . Right ear pain 10/24/2014  . Rash and nonspecific skin eruption 10/21/2014  . Lump of skin of back 10/21/2014  . Chest pain 05/28/2013    Past Surgical History:  Procedure Laterality Date  . TONSILECTOMY, ADENOIDECTOMY, BILATERAL MYRINGOTOMY AND TUBES Bilateral     OB History    Gravida Para Term Preterm AB Living   1 1 1     1    SAB TAB Ectopic Multiple Live Births           1       Home Medications    Prior to Admission medications   Medication Sig Start Date End Date Taking? Authorizing Provider  diclofenac (VOLTAREN) 75 MG EC tablet Take 1 tablet (75 mg total) by mouth 2 (two) times daily. 06/20/17  Yes Emi Belfast, FNP  loratadine (CLARITIN) 10 MG tablet Take 10 mg by mouth daily.   Yes [provider]  Norethindrone-Ethinyl Estradiol-Fe Biphas (LO LOESTRIN FE) 1 MG-10 MCG / 10 MCG tablet Take 1 tablet by mouth daily. 03/26/17 06/21/17 Yes Nadara Mustard, MD  SUMAtriptan (IMITREX) 50 MG tablet May repeat in 2 hours once if headache persists or recurs. Patient taking differently: Take 50 mg by mouth once as needed for migraine. May repeat in 2 hours once  if headache persists or recurs 06/20/17  Yes Emi BelfastGessner, Deborah B, FNP  valACYclovir (VALTREX) 500 MG tablet Take 1 tablet (500 mg total) by mouth 2 (two) times daily. Patient not taking: Reported on 06/20/2017 11/23/15   Sherlene Shamsullo, Teresa L, MD    Family History Family History  Problem Relation Age of Onset  . Hypertension Mother        RUNS ON MAT SIDE OF FAMILY  . Migraines Father   . Hypertension Maternal Grandmother   . Heart disease Maternal Grandmother   . Heart disease Maternal Aunt   . Heart disease Maternal Uncle     Social History Social History  Substance Use Topics  . Smoking status: Never Smoker  . Smokeless tobacco: Never Used  . Alcohol use Yes      Comment: OCC     Allergies   Patient has no known allergies.   Review of Systems Review of Systems  Constitutional: Positive for activity change.  Gastrointestinal: Positive for nausea.  Allergic/Immunologic: Negative for immunocompromised state.  Neurological: Positive for headaches. Negative for syncope, speech difficulty and numbness.  Hematological: Does not bruise/bleed easily.  All other systems reviewed and are negative.    Physical Exam Updated Vital Signs BP 122/79   Pulse 75   Temp 98.3 F (36.8 C) (Oral)   Resp 16   Ht 5\' 4"  (1.626 m)   Wt 94.8 kg (209 lb)   LMP 05/29/2017   SpO2 100%   BMI 35.87 kg/m   Physical Exam  Constitutional: She is oriented to person, place, and time. She appears well-developed.  HENT:  Head: Normocephalic and atraumatic.  Eyes: Pupils are equal, round, and reactive to light. EOM are normal.  No retinal hemorrhage on gross ocular exam  Neck: Normal range of motion. Neck supple.  Cardiovascular: Normal rate.   Pulmonary/Chest: Effort normal.  Abdominal: Bowel sounds are normal.  Neurological: She is alert and oriented to person, place, and time. No cranial nerve deficit. Coordination normal.  Skin: Skin is warm and dry.  Nursing note and vitals reviewed.    ED Treatments / Results  Labs (all labs ordered are listed, but only abnormal results are displayed) Labs Reviewed  I-STAT CHEM 8, ED - Abnormal; Notable for the following:       Result Value   BUN 4 (*)    All other components within normal limits  CBC WITH DIFFERENTIAL/PLATELET    EKG  EKG Interpretation None       Radiology Ct Head Wo Contrast  Result Date: 06/21/2017 CLINICAL DATA:  Headache. EXAM: CT HEAD WITHOUT CONTRAST TECHNIQUE: Contiguous axial images were obtained from the base of the skull through the vertex without intravenous contrast. COMPARISON:  None. FINDINGS: Brain: There is no evidence for acute hemorrhage, hydrocephalus, mass lesion,  or abnormal extra-axial fluid collection. No definite CT evidence for acute infarction. Vascular: No hyperdense vessel or unexpected calcification. Skull: No evidence for fracture. No worrisome lytic or sclerotic lesion. Sinuses/Orbits: The visualized paranasal sinuses and mastoid air cells are clear. Visualized portions of the globes and intraorbital fat are unremarkable. Other: None. IMPRESSION: No acute intracranial abnormality. Electronically Signed   By: Kennith CenterEric  Mansell M.D.   On: 06/21/2017 20:45    Procedures Procedures (including critical care time)  Medications Ordered in ED Medications  metoCLOPramide (REGLAN) injection 10 mg (10 mg Intravenous Given 06/21/17 2052)  diphenhydrAMINE (BENADRYL) injection 25 mg (25 mg Intravenous Given 06/21/17 2052)  sodium chloride 0.9 % bolus 1,000 mL (0  mLs Intravenous Stopped 06/21/17 2141)     Initial Impression / Assessment and Plan / ED Course  I have reviewed the triage vital signs and the nursing notes.  Pertinent labs & imaging results that were available during my care of the patient were reviewed by me and considered in my medical decision making (see chart for details).  Clinical Course as of Jun 21 2326  Thu Jun 21, 2017  2324 Patient reassessed. Pt is comfortable at this time, however the medicine didn't help her with the pain.  Results of the workup discussed.  Given the persistent headache we discussed attempting sphenopalatine ganglion nerve block.  Patient refused to get Toradol therefore we discussed the sphenopalatine option.  Patient declined a sphenopalatine ganglion nerve block as well.  We also discussed getting a neuro consultation.  I informed her that in my opinion given the fact that she is on anticoagulation, she is at some risk of having thrombosis.  Overall the risk is low but it is not nil.  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  I do not see any  issues with that option given that patient has no acute neurologic deficits.  Strict ER return precautions have been discussed, and patient is agreeing with the plan and is comfortable with the workup done and the recommendations from the ER.   [AN]    Clinical Course User Index [AN] Derwood Kaplan, MD    DDX includes: Primary headaches - including migrainous headaches, cluster headaches, tension headaches. Carotid dissection Cavernous sinus thrombosis Tumor Vascular headaches AV malformation Brain aneurysm IIH  A/P: Pt comes in with cc of headaches. Patient reports history of migraines, however the current headaches are not typical of her migraine headaches.  Patient has never had headaches as severe as the current one, nor has she had a headache that lasted 2 weeks straight.  The description of her headache does have some typical features of migraine, such as pulsatile/throbbing nature to the headache with associated nausea and light sensitivity.  However, given that she reports that her current headaches are not similar to her migraines, she had no response to sumatriptan at all we have to consider other etiologies for this 2-week long moderate to severe headache.  Given that patient is on birth control pill thrombosis is in the differential diagnosis.  Patient also reports that the headache is worse at nighttime which gives [place] concerns for possible mass or idiopathic intracranial hypertension.   We will start with a CAT scan of her head.  We will also order some IV fluid and medications.  We will reassess patient to see if she needs further medical intervention including sphenopalatine ganglion nerve block or neuro consultation.  Final Clinical Impressions(s) / ED Diagnoses   Final diagnoses:  Severe headache  Status migrainosus    New Prescriptions Discharge Medication List as of 06/21/2017  9:21 PM       Derwood Kaplan, MD 06/21/17 2327

## 2017-06-21 NOTE — ED Notes (Signed)
PT states understanding of care given, follow up care. PT ambulated from ED to car with a steady gait.  

## 2017-06-22 ENCOUNTER — Telehealth: Payer: Self-pay | Admitting: *Deleted

## 2017-06-22 NOTE — Telephone Encounter (Signed)
Patient verbalized understanding of upcoming appt w/ Dr. Lucia GaskinsAhern on 06/27/17 at 4:30 with an arrival time of 4:00.

## 2017-06-25 ENCOUNTER — Ambulatory Visit: Payer: BLUE CROSS/BLUE SHIELD | Admitting: Family

## 2017-06-25 ENCOUNTER — Ambulatory Visit: Payer: BLUE CROSS/BLUE SHIELD | Admitting: Neurology

## 2017-06-25 ENCOUNTER — Encounter: Payer: Self-pay | Admitting: Neurology

## 2017-06-25 VITALS — BP 128/81 | HR 77 | Ht 64.0 in | Wt 207.4 lb

## 2017-06-25 DIAGNOSIS — G039 Meningitis, unspecified: Secondary | ICD-10-CM

## 2017-06-25 DIAGNOSIS — R519 Headache, unspecified: Secondary | ICD-10-CM

## 2017-06-25 DIAGNOSIS — R51 Headache with orthostatic component, not elsewhere classified: Secondary | ICD-10-CM

## 2017-06-25 MED ORDER — TIZANIDINE HCL 4 MG PO TABS: 4.0000 mg | ORAL_TABLET | Freq: Four times a day (QID) | ORAL | 11 refills | Status: DC | PRN

## 2017-06-25 MED ORDER — METHYLPREDNISOLONE 4 MG PO TBPK
ORAL_TABLET | ORAL | 11 refills | Status: DC
Start: 2017-06-25 — End: 2017-09-10

## 2017-06-25 NOTE — Progress Notes (Signed)
[facility]    Provider:  Dr Lucia Gaskins Referring Provider: Sherlene Shams, MD Primary Care Physician:  Sherlene Shams, MD  CC:  Severe headache  HPI:  Brooke Mclaughlin is a 31 y.o. female here as a referral from Dr. Darrick Huntsman for headache. Past headaches included aura of spots in both eyes and then the headache with light sensitivity, pounding on the right around the eye, nausea, no vomiting, last 4 hours to a day, she would get them 2x a year, she has headaches 15 days a month, more pressure, dull headache, in the forehead. Headache started with a "normal headache", Monday night she woke up with pounding headache 2 weeks ago, woke up Tuesday morning pounding, can be either side, pounding, sometimes in the forehead around the eye either side, more severe, intractable for 2 weeks, a lot of nausea, light bothers her and sound bothers her, had a Toradol shot, ibuprofen, been to the emergency room, tylenol, excedrin, aspirin, sumatriptan and diclofenac and she didn't like how they made her feel. She has a lot of neck stiffness. No fevers, getting worse in the back of the head, every step would be pounding, her neck is flexible but more muscle pain as opposed to neck stuffness. Ni recent illnesses, no new medications. 2-3 months new birth control No hearing changes, +vision changes.    Reviewed notes, labs and imaging from outside physicians, which showed:    Ct showed No acute intracranial abnormalities including mass lesion or mass effect, hydrocephalus, extra-axial fluid collection, midline shift, hemorrhage, or acute infarction, large ischemic events (personally reviewed images)  CBC, CMP unremarkable   Review of Systems: Patient complains of symptoms per HPI as well as the following symptoms: stiff neck. Pertinent negatives and positives per HPI. All others negative.   Social History   Socioeconomic History  . Marital status: Single    Spouse name: Not on file  . Number of  children: 1  . Years of education: 46  . Highest education level: Not on file  Social Needs  . Financial resource strain: Not on file  . Food insecurity - worry: Not on file  . Food insecurity - inability: Not on file  . Transportation needs - medical: No  . Transportation needs - non-medical: No  Occupational History  . Occupation: ENGINEER    Comment: DRAFTING  Tobacco Use  . Smoking status: Never Smoker  . Smokeless tobacco: Never Used  Substance and Sexual Activity  . Alcohol use: Yes    Comment: OCC  . Drug use: No  . Sexual activity: Yes  Other Topics Concern  . Not on file  Social History Narrative   Lives at home with boyfriend, son, and boyfriend's son   Right handed   Drinks </= 1 cup caffeine daily    Family History  Problem Relation Age of Onset  . Hypertension Mother        RUNS ON MAT SIDE OF FAMILY  . Migraines Father   . Hypertension Maternal Grandmother   . Heart disease Maternal Grandmother   . Heart disease Maternal Aunt   . Heart disease Maternal Uncle     Past Medical History:  Diagnosis Date  . Frequent headaches   . History of Papanicolaou smear of cervix 12/31/13; 05/01/16   NEG; -/-  . Irregular menses   . Migraines   . Palpitations   . Syncope 2003   has felt that way since but did not faint  . UTI (lower  urinary tract infection)     Past Surgical History:  Procedure Laterality Date  . TONSILECTOMY, ADENOIDECTOMY, BILATERAL MYRINGOTOMY AND TUBES Bilateral     Current Outpatient Medications  Medication Sig Dispense Refill  . loratadine (CLARITIN) 10 MG tablet Take 10 mg by mouth daily.    . methylPREDNISolone (MEDROL DOSEPAK) 4 MG TBPK tablet Take pills once daily together with food (6,5,4,3,2,1) 21 tablet 11  . Norethindrone-Ethinyl Estradiol-Fe Biphas (LO LOESTRIN FE) 1 MG-10 MCG / 10 MCG tablet Take 1 tablet by mouth daily. 84 tablet 3  . tiZANidine (ZANAFLEX) 4 MG tablet Take 1 tablet (4 mg total) every 6 (six) hours as needed  by mouth for muscle spasms. 90 tablet 11   No current facility-administered medications for this visit.     Allergies as of 06/25/2017  . (No Known Allergies)    Vitals: BP 128/81   Pulse 77   Ht 5\' 4"  (1.626 m)   Wt 207 lb 6.4 oz (94.1 kg)   LMP 05/29/2017   BMI 35.60 kg/m  Last Weight:  Wt Readings from Last 1 Encounters:  06/25/17 207 lb 6.4 oz (94.1 kg)   Last Height:   Ht Readings from Last 1 Encounters:  06/25/17 5\' 4"  (1.626 m)   Physical exam: Exam: Gen: NAD, conversant, well nourised, obese, well groomed                     CV: RRR, no MRG. No Carotid Bruits. No peripheral edema, warm, nontender Eyes: Conjunctivae clear without exudates or hemorrhage  Neuro: Detailed Neurologic Exam  Speech:    Speech is normal; fluent and spontaneous with normal comprehension.  Cognition:    The patient is oriented to person, place, and time;     recent and remote memory intact;     language fluent;     normal attention, concentration,     fund of knowledge Cranial Nerves:    The pupils are equal, round, and reactive to light. The fundi are normal and spontaneous venous pulsations are present. Visual fields are full to finger confrontation. Extraocular movements are intact. Trigeminal sensation is intact and the muscles of mastication are normal. The face is symmetric. The palate elevates in the midline. Hearing intact. Voice is normal. Shoulder shrug is normal. The tongue has normal motion without fasciculations.   Coordination:    Normal finger to nose and heel to shin. Normal rapid alternating movements.   Gait:    Heel-toe and tandem gait are normal.   Motor Observation:    No asymmetry, no atrophy, and no involuntary movements noted. Tone:    Normal muscle tone.    Posture:    Posture is normal. normal erect    Strength:    Strength is V/V in the upper and lower limbs.      Sensation: intact to LT     Reflex Exam:  DTR's:    Deep tendon reflexes in the  upper and lower extremities are normal bilaterally.   Toes:    The toes are downgoing bilaterally.   Clonus:    Clonus is absent.       Assessment/Plan:  Patient with severe intractable headache, positional, with stiff neck need MRI asap to eval for meningitis, compressive lesion or space occupying mass, intracanial HTN.  MRI brain w/wo contrast  Naomie DeanAntonia Onyekachi Gathright, MD  [facility] [address] [place], [place] [id number]  Phone [phone] Fax [phone]

## 2017-06-25 NOTE — Patient Instructions (Signed)
Methylprednisolone tablets What is this medicine? METHYLPREDNISOLONE (meth ill pred NISS oh lone) is a corticosteroid. It is commonly used to treat inflammation of the skin, joints, lungs, and other organs. Common conditions treated include asthma, allergies, and arthritis. It is also used for other conditions, such as blood disorders and diseases of the adrenal glands. This medicine may be used for other purposes; ask your health care provider or pharmacist if you have questions. COMMON BRAND NAME(S): Medrol, Medrol Dosepak What should I tell my health care provider before I take this medicine? They need to know if you have any of these conditions: -Cushing's syndrome -eye disease, vision problems -diabetes -glaucoma -heart disease -high blood pressure -infection (especially a virus infection such as chickenpox, cold sores, or herpes) -liver disease -mental illness -myasthenia gravis -osteoporosis -recently received or scheduled to receive a vaccine -seizures -stomach or intestine problems -thyroid disease -an unusual or allergic reaction to lactose, methylprednisolone, other medicines, foods, dyes, or preservatives -pregnant or trying to get pregnant -breast-feeding How should I use this medicine? Take this medicine by mouth with a glass of water. Follow the directions on the prescription label. Take this medicine with food. If you are taking this medicine once a day, take it in the morning. Do not take it more often than directed. Do not suddenly stop taking your medicine because you may develop a severe reaction. Your doctor will tell you how much medicine to take. If your doctor wants you to stop the medicine, the dose may be slowly lowered over time to avoid any side effects. Talk to your pediatrician regarding the use of this medicine in children. Special care may be needed. Overdosage: If you think you have taken too much of this medicine contact a poison control center or  emergency room at once. NOTE: This medicine is only for you. Do not share this medicine with others. What if I miss a dose? If you miss a dose, take it as soon as you can. If it is almost time for your next dose, talk to your doctor or health care professional. You may need to miss a dose or take an extra dose. Do not take double or extra doses without advice. What may interact with this medicine? Do not take this medicine with any of the following medications: -alefacept -echinacea -live virus vaccines -metyrapone -mifepristone This medicine may also interact with the following medications: -amphotericin B -aspirin and aspirin-like medicines -certain antibiotics like erythromycin, clarithromycin, troleandomycin -certain medicines for diabetes -certain medicines for fungal infections like ketoconazole -certain medicines for seizures like carbamazepine, phenobarbital, phenytoin -certain medicines that treat or prevent blood clots like warfarin -cholestyramine -cyclosporine -digoxin -diuretics -female hormones, like estrogens and birth control pills -isoniazid -NSAIDs, medicines for pain inflammation, like ibuprofen or naproxen -other medicines for myasthenia gravis -rifampin -vaccines This list may not describe all possible interactions. Give your health care provider a list of all the medicines, herbs, non-prescription drugs, or dietary supplements you use. Also tell them if you smoke, drink alcohol, or use illegal drugs. Some items may interact with your medicine. What should I watch for while using this medicine? Tell your doctor or healthcare professional if your symptoms do not start to get better or if they get worse. Do not stop taking except on your doctor's advice. You may develop a severe reaction. Your doctor will tell you how much medicine to take. This medicine may increase your risk of getting an infection. Tell your doctor or health care professional if  you are around  anyone with measles or chickenpox, or if you develop sores or blisters that do not heal properly. This medicine may affect blood sugar levels. If you have diabetes, check with your doctor or health care professional before you change your diet or the dose of your diabetic medicine. Tell your doctor or health care professional right away if you have any change in your eyesight. Using this medicine for a long time may increase your risk of low bone mass. Talk to your doctor about bone health. What side effects may I notice from receiving this medicine? Side effects that you should report to your doctor or health care professional as soon as possible: -allergic reactions like skin rash, itching or hives, swelling of the face, lips, or tongue -bloody or tarry stools -changes in vision -hallucination, loss of contact with reality -muscle cramps -muscle pain -palpitations -signs and symptoms of high blood sugar such as dizziness; dry mouth; dry skin; fruity breath; nausea; stomach pain; increased hunger or thirst; increased urination -signs and symptoms of infection like fever or chills; cough; sore throat; pain or trouble passing urine -trouble passing urine or change in the amount of urine Side effects that usually do not require medical attention (report to your doctor or health care professional if they continue or are bothersome): -changes in emotions or mood -constipation -diarrhea -excessive hair growth on the face or body -headache -nausea, vomiting -trouble sleeping -weight gain This list may not describe all possible side effects. Call your doctor for medical advice about side effects. You may report side effects to FDA at 1-800-FDA-1088. Where should I keep my medicine? Keep out of the reach of children. Store at room temperature between 20 and 25 degrees C (68 and 77 degrees F). Throw away any unused medicine after the expiration date. NOTE: This sheet is a summary. It may not  cover all possible information. If you have questions about this medicine, talk to your doctor, pharmacist, or health care provider.  2018 Elsevier/Gold Standard (2015-10-14 15:53:30)    Tizanidine tablets or capsules What is this medicine? TIZANIDINE (tye ZAN i deen) helps to relieve muscle spasms. It may be used to help in the treatment of multiple sclerosis and spinal cord injury. This medicine may be used for other purposes; ask your health care provider or pharmacist if you have questions. COMMON BRAND NAME(S): Zanaflex What should I tell my health care provider before I take this medicine? They need to know if you have any of these conditions: -kidney disease -liver disease -low blood pressure -mental disorder -an unusual or allergic reaction to tizanidine, other medicines, lactose (tablets only), foods, dyes, or preservatives -pregnant or trying to get pregnant -breast-feeding How should I use this medicine? Take this medicine by mouth with a full glass of water. Take this medicine on an empty stomach, at least 30 minutes before or 2 hours after food. Do not take with food unless you talk with your doctor. Follow the directions on the prescription label. Take your medicine at regular intervals. Do not take your medicine more often than directed. Do not stop taking except on your doctor's advice. Suddenly stopping the medicine can be very dangerous. Talk to your pediatrician regarding the use of this medicine in children. Patients over 32 years old may have a stronger reaction and need a smaller dose. Overdosage: If you think you have taken too much of this medicine contact a poison control center or emergency room at once. NOTE: This medicine  is only for you. Do not share this medicine with others. What if I miss a dose? If you miss a dose, take it as soon as you can. If it is almost time for your next dose, take only that dose. Do not take double or extra doses. What may interact  with this medicine? Do not take this medicine with any of the following medications: -ciprofloxacin -cisapride -dofetilide -dronedarone -fluvoxamine -narcotic medicines for cough -pimozide -thiabendazole -thioridazine -ziprasidone This medicine may also interact with the following medications: -acyclovir -alcohol -antihistamines for allergy, cough and cold -baclofen -certain antibiotics like levofloxacin, ofloxacin -certain medicines for anxiety or sleep -certain medicines for blood pressure, heart disease, irregular heart beat -certain medicines for depression like amitriptyline, fluoxetine, sertraline -certain medicines for seizures like phenobarbital, primidone -certain medicines for stomach problems like cimetidine, famotidine -female hormones, like estrogens or progestins and birth control pills, patches, rings, or injections -general anesthetics like halothane, isoflurane, methoxyflurane, propofol -local anesthetics like lidocaine, pramoxine, tetracaine -medicines that relax muscles for surgery -narcotic medicines for pain -other medicines that prolong the QT interval (cause an abnormal heart rhythm) -phenothiazines like chlorpromazine, mesoridazine, prochlorperazine -ticlopidine -zileuton This list may not describe all possible interactions. Give your health care provider a list of all the medicines, herbs, non-prescription drugs, or dietary supplements you use. Also tell them if you smoke, drink alcohol, or use illegal drugs. Some items may interact with your medicine. What should I watch for while using this medicine? Tell your doctor or health care professional if your symptoms do not start to get better or if they get worse. You may get drowsy or dizzy. Do not drive, use machinery, or do anything that needs mental alertness until you know how this medicine affects you. Do not stand or sit up quickly, especially if you are an older patient. This reduces the risk of dizzy  or fainting spells. Alcohol may interfere with the effect of this medicine. Avoid alcoholic drinks. If you are taking another medicine that also causes drowsiness, you may have more side effects. Give your health care provider a list of all medicines you use. Your doctor will tell you how much medicine to take. Do not take more medicine than directed. Call emergency for help if you have problems breathing or unusual sleepiness. Your mouth may get dry. Chewing sugarless gum or sucking hard candy, and drinking plenty of water may help. Contact your doctor if the problem does not go away or is severe. What side effects may I notice from receiving this medicine? Side effects that you should report to your doctor or health care professional as soon as possible: -allergic reactions like skin rash, itching or hives, swelling of the face, lips, or tongue -breathing problems -hallucinations -signs and symptoms of liver injury like dark yellow or brown urine; general ill feeling or flu-like symptoms; light-colored stools; loss of appetite; nausea; right upper quadrant belly pain; unusually weak or tired; yellowing of the eyes or skin -signs and symptoms of low blood pressure like dizziness; feeling faint or lightheaded, falls; unusually weak or tired -unusually slow heartbeat -unusually weak or tired Side effects that usually do not require medical attention (report to your doctor or health care professional if they continue or are bothersome): -blurred vision -constipation -dizziness -dry mouth -tiredness This list may not describe all possible side effects. Call your doctor for medical advice about side effects. You may report side effects to FDA at 1-800-FDA-1088. Where should I keep my medicine? Keep out of  the reach of children. Store at room temperature between 15 and 30 degrees C (59 and 86 degrees F). Throw away any unused medicine after the expiration date. NOTE: This sheet is a summary. It may  not cover all possible information. If you have questions about this medicine, talk to your doctor, pharmacist, or health care provider.  2018 Elsevier/Gold Standard (2015-05-18 13:52:12)

## 2017-06-26 ENCOUNTER — Ambulatory Visit (INDEPENDENT_AMBULATORY_CARE_PROVIDER_SITE_OTHER): Payer: BLUE CROSS/BLUE SHIELD

## 2017-06-26 ENCOUNTER — Other Ambulatory Visit: Payer: Self-pay | Admitting: Neurology

## 2017-06-26 DIAGNOSIS — R51 Headache with orthostatic component, not elsewhere classified: Secondary | ICD-10-CM

## 2017-06-26 DIAGNOSIS — G039 Meningitis, unspecified: Secondary | ICD-10-CM | POA: Diagnosis not present

## 2017-06-26 DIAGNOSIS — R519 Headache, unspecified: Secondary | ICD-10-CM

## 2017-06-26 DIAGNOSIS — G43711 Chronic migraine without aura, intractable, with status migrainosus: Secondary | ICD-10-CM

## 2017-06-26 MED ORDER — FROVATRIPTAN SUCCINATE 2.5 MG PO TABS: ORAL_TABLET | ORAL | 0 refills | Status: DC

## 2017-06-26 MED ORDER — GADOPENTETATE DIMEGLUMINE 469.01 MG/ML IV SOLN: 19.0000 mL | Freq: Once | INTRAVENOUS | Status: AC | PRN

## 2017-06-27 ENCOUNTER — Ambulatory Visit: Payer: Self-pay | Admitting: Neurology

## 2017-06-29 ENCOUNTER — Encounter: Payer: Self-pay | Admitting: Neurology

## 2017-07-24 ENCOUNTER — Telehealth: Payer: Self-pay

## 2017-07-24 NOTE — Telephone Encounter (Signed)
Copied from CRM [id number]. Topic: Appointment Scheduling - Scheduling Inquiry for Clinic >> Jul 24, 2017  4:20 PM Landry MellowFoltz, Melissa J wrote: Reason for CRM: pt sent my chart message, she would like to be patient of Deboraha SprangDebbie Gessner, transfer from Dr Darrick Huntsmanullo. She has been to the [place] office, but hasn't seen Dr Darrick Huntsmanullo since 2014. Pleace contact pt by either my chart or phone to schedule if this is ok with you.  Thanks

## 2017-07-27 ENCOUNTER — Encounter: Payer: Self-pay | Admitting: Family Medicine

## 2017-07-27 NOTE — Telephone Encounter (Signed)
That is fine with me.

## 2017-07-27 NOTE — Telephone Encounter (Signed)
Sent patient a Mychart message and changed PCP.

## 2017-07-27 NOTE — Telephone Encounter (Signed)
Fine with me as well. Thanks Sun MicrosystemsDeborah

## 2017-09-10 ENCOUNTER — Other Ambulatory Visit: Payer: Self-pay | Admitting: Adult Health

## 2017-09-10 ENCOUNTER — Encounter: Payer: Self-pay | Admitting: Adult Health

## 2017-09-10 ENCOUNTER — Ambulatory Visit (INDEPENDENT_AMBULATORY_CARE_PROVIDER_SITE_OTHER): Payer: BLUE CROSS/BLUE SHIELD | Admitting: Adult Health

## 2017-09-10 VITALS — BP 114/75 | HR 76 | Ht 64.0 in | Wt 213.4 lb

## 2017-09-10 DIAGNOSIS — Z0000 Encounter for general adult medical examination without abnormal findings: Secondary | ICD-10-CM | POA: Diagnosis not present

## 2017-09-10 DIAGNOSIS — Z8249 Family history of ischemic heart disease and other diseases of the circulatory system: Secondary | ICD-10-CM

## 2017-09-10 DIAGNOSIS — G43909 Migraine, unspecified, not intractable, without status migrainosus: Secondary | ICD-10-CM | POA: Insufficient documentation

## 2017-09-10 DIAGNOSIS — R5383 Other fatigue: Secondary | ICD-10-CM

## 2017-09-10 DIAGNOSIS — Z833 Family history of diabetes mellitus: Secondary | ICD-10-CM | POA: Diagnosis not present

## 2017-09-10 DIAGNOSIS — G43801 Other migraine, not intractable, with status migrainosus: Secondary | ICD-10-CM

## 2017-09-10 MED ORDER — MONTELUKAST SODIUM 10 MG PO TABS: 10.0000 mg | ORAL_TABLET | Freq: Every day | ORAL | 1 refills | Status: DC

## 2017-09-10 NOTE — Assessment & Plan Note (Signed)
06/2017- intractable migraine- evaluated at ED and referred to Neurology Imaging negative for acute intracranial abnormality- treated with methylprednisone and tizandidine.  She has not followed-up with Neurology since then. She estimates 4 migraines/year

## 2017-09-10 NOTE — Patient Instructions (Addendum)
Heart-Healthy Eating Plan Many factors influence your heart health, including eating and exercise habits. Heart (coronary) risk increases with abnormal blood fat (lipid) levels. Heart-healthy meal planning includes limiting unhealthy fats, increasing healthy fats, and making other small dietary changes. This includes maintaining a healthy body weight to help keep lipid levels within a normal range. What is my plan? Your health care provider recommends that you:  Get no more than ___25___% of the total calories in your daily diet from fat.  Limit your intake of saturated fat to less than ___5__% of your total calories each day.  Limit the amount of cholesterol in your diet to less than _300__ mg per day.  What types of fat should I choose?  Choose healthy fats more often. Choose monounsaturated and polyunsaturated fats, such as olive oil and canola oil, flaxseeds, walnuts, almonds, and seeds.  Eat more omega-3 fats. Good choices include salmon, mackerel, sardines, tuna, flaxseed oil, and ground flaxseeds. Aim to eat fish at least two times each week.  Limit saturated fats. Saturated fats are primarily found in animal products, such as meats, butter, and cream. Plant sources of saturated fats include palm oil, palm kernel oil, and coconut oil.  Avoid foods with partially hydrogenated oils in them. These contain trans fats. Examples of foods that contain trans fats are stick margarine, some tub margarines, cookies, crackers, and other baked goods. What general guidelines do I need to follow?  Check food labels carefully to identify foods with trans fats or high amounts of saturated fat.  Fill one half of your plate with vegetables and green salads. Eat 4-5 servings of vegetables per day. A serving of vegetables equals 1 cup of raw leafy vegetables,  cup of raw or cooked cut-up vegetables, or  cup of vegetable juice.  Fill one fourth of your plate with whole grains. Look for the word "whole"  as the first word in the ingredient list.  Fill one fourth of your plate with lean protein foods.  Eat 4-5 servings of fruit per day. A serving of fruit equals one medium whole fruit,  cup of dried fruit,  cup of fresh, frozen, or canned fruit, or  cup of 100% fruit juice.  Eat more foods that contain soluble fiber. Examples of foods that contain this type of fiber are apples, broccoli, carrots, beans, peas, and barley. Aim to get 20-30 g of fiber per day.  Eat more home-cooked food and less restaurant, buffet, and fast food.  Limit or avoid alcohol.  Limit foods that are high in starch and sugar.  Avoid fried foods.  Cook foods by using methods other than frying. Baking, boiling, grilling, and broiling are all great options. Other fat-reducing suggestions include: ? Removing the skin from poultry. ? Removing all visible fats from meats. ? Skimming the fat off of stews, soups, and gravies before serving them. ? Steaming vegetables in water or broth.  Lose weight if you are overweight. Losing just 5-10% of your initial body weight can help your overall health and prevent diseases such as diabetes and heart disease.  Increase your consumption of nuts, legumes, and seeds to 4-5 servings per week. One serving of dried beans or legumes equals  cup after being cooked, one serving of nuts equals 1 ounces, and one serving of seeds equals  ounce or 1 tablespoon.  You may need to monitor your salt (sodium) intake, especially if you have high blood pressure. Talk with your health care provider or dietitian to get  more information about reducing sodium. What foods can I eat? Grains  Breads, including [place], white, pita, wheat, raisin, rye, oatmeal, and [place]. Tortillas that are neither fried nor made with lard or trans fat. Low-fat rolls, including hotdog and hamburger buns and English muffins. Biscuits. Muffins. Waffles. Pancakes. Light popcorn. Whole-grain cereals. Flatbread. Melba  toast. Pretzels. Breadsticks. Rusks. Low-fat snacks and crackers, including oyster, saltine, matzo, graham, animal, and rye. Rice and pasta, including brown rice and those that are made with whole wheat. Vegetables All vegetables. Fruits All fruits, but limit coconut. Meats and Other Protein Sources Lean, well-trimmed beef, veal, pork, and lamb. Chicken and [place] without skin. All fish and shellfish. Wild duck, rabbit, pheasant, and venison. Egg whites or low-cholesterol egg substitutes. Dried beans, peas, lentils, and tofu.Seeds and most nuts. Dairy Low-fat or nonfat cheeses, including ricotta, string, and mozzarella. Skim or 1% milk that is liquid, powdered, or evaporated. Buttermilk that is made with low-fat milk. Nonfat or low-fat yogurt. Beverages Mineral water. Diet carbonated beverages. Sweets and Desserts Sherbets and fruit ices. Honey, jam, marmalade, jelly, and syrups. Meringues and gelatins. Pure sugar candy, such as hard candy, jelly beans, gumdrops, mints, marshmallows, and small amounts of dark chocolate. [employer] Eat all sweets and desserts in moderation. Fats and Oils Nonhydrogenated (trans-free) margarines. Vegetable oils, including soybean, sesame, sunflower, olive, peanut, safflower, corn, canola, and cottonseed. Salad dressings or mayonnaise that are made with a vegetable oil. Limit added fats and oils that you use for cooking, baking, salads, and as spreads. Other Cocoa powder. Coffee and tea. All seasonings and condiments. The items listed above may not be a complete list of recommended foods or beverages. Contact your dietitian for more options. What foods are not recommended? Grains Breads that are made with saturated or trans fats, oils, or whole milk. Croissants. Butter rolls. Cheese breads. Sweet rolls. Donuts. Buttered popcorn. Chow mein noodles. High-fat crackers, such as cheese or butter crackers. Meats and Other Protein Sources Fatty meats, such as  hotdogs, short ribs, sausage, spareribs, bacon, ribeye roast or steak, and mutton. High-fat deli meats, such as salami and bologna. Caviar. Domestic duck and goose. Organ meats, such as kidney, liver, sweetbreads, brains, gizzard, chitterlings, and heart. Dairy Cream, sour cream, cream cheese, and creamed cottage cheese. Whole milk cheeses, including blue (bleu), Monterey Jack, [place], [place], American, [place], Swiss, [place], [place], and [place]. Whole or 2% milk that is liquid, evaporated, or condensed. Whole buttermilk. Cream sauce or high-fat cheese sauce. Yogurt that is made from whole milk. Beverages Regular sodas and drinks with added sugar. Sweets and Desserts Frosting. Pudding. Cookies. Cakes other than angel food cake. Candy that has milk chocolate or white chocolate, hydrogenated fat, butter, coconut, or unknown ingredients. Buttered syrups. Full-fat ice cream or ice cream drinks. Fats and Oils Gravy that has suet, meat fat, or shortening. Cocoa butter, hydrogenated oils, palm oil, coconut oil, palm kernel oil. These can often be found in baked products, candy, fried foods, nondairy creamers, and whipped toppings. Solid fats and shortenings, including bacon fat, salt pork, lard, and butter. Nondairy cream substitutes, such as coffee creamers and sour cream substitutes. Salad dressings that are made of unknown oils, cheese, or sour cream. The items listed above may not be a complete list of foods and beverages to avoid. Contact your dietitian for more information. This information is not intended to replace advice given to you by your health care provider. Make sure you discuss any questions you have with your health care  provider. Document Released: 05/16/2008 Document Revised: 02/25/2016 Document Reviewed: 01/29/2014 Elsevier Interactive Patient Education  2018 [employer]    Tension Headache A tension headache is a feeling of pain, pressure, or aching that is often felt over the  front and sides of the head. The pain can be dull, or it can feel tight (constricting). Tension headaches are not normally associated with nausea or vomiting, and they do not get worse with physical activity. Tension headaches can last from 30 minutes to several days. This is the most common type of headache. CAUSES The exact cause of this condition is not known. Tension headaches often begin after stress, anxiety, or depression. Other triggers may include:  Alcohol.  Too much caffeine, or caffeine withdrawal.  Respiratory infections, such as colds, flu, or sinus infections.  Dental problems or teeth clenching.  Fatigue.  Holding your head and neck in the same position for a long period of time, such as while using a computer.  Smoking. SYMPTOMS Symptoms of this condition include:  A feeling of pressure around the head.  Dull, aching head pain.  Pain felt over the front and sides of the head.  Tenderness in the muscles of the head, neck, and shoulders. DIAGNOSIS This condition may be diagnosed based on your symptoms and a physical exam. Tests may be done, such as a CT scan or an MRI of your head. These tests may be done if your symptoms are severe or unusual. TREATMENT This condition may be treated with lifestyle changes and medicines to help relieve symptoms. HOME CARE INSTRUCTIONS Managing Pain  Take over-the-counter and prescription medicines only as told by your health care provider.  Lie down in a dark, quiet room when you have a headache.  If directed, apply ice to the head and neck area: ? Put ice in a plastic bag. ? Place a towel between your skin and the bag. ? Leave the ice on for 20 minutes, 2-3 times per day.  Use a heating pad or a hot shower to apply heat to the head and neck area as told by your health care provider. Eating and Drinking  Eat meals on a regular schedule.  Limit alcohol use.  Decrease your caffeine intake, or stop using  caffeine. General Instructions  Keep all follow-up visits as told by your health care provider. This is important.  Keep a headache journal to help find out what may trigger your headaches. For example, write down: ? What you eat and drink. ? How much sleep you get. ? Any change to your diet or medicines.  Try massage or other relaxation techniques.  Limit stress.  Sit up straight, and avoid tensing your muscles.  Do not use tobacco products, including cigarettes, chewing tobacco, or e-cigarettes. If you need help quitting, ask your health care provider.  Exercise regularly as told by your health care provider.  Get 7-9 hours of sleep, or the amount recommended by your health care provider. SEEK MEDICAL CARE IF:  Your symptoms are not helped by medicine.  You have a headache that is different from what you normally experience.  You have nausea or you vomit.  You have a fever. SEEK IMMEDIATE MEDICAL CARE IF:  Your headache becomes severe.  You have repeated vomiting.  You have a stiff neck.  You have a loss of vision.  You have problems with speech.  You have pain in your eye or ear.  You have muscular weakness or loss of muscle control.  You  lose your balance or you have trouble walking.  You feel faint or you pass out.  You have confusion. This information is not intended to replace advice given to you by your health care provider. Make sure you discuss any questions you have with your health care provider. Document Released: 08/07/2005 Document Revised: 04/28/2015 Document Reviewed: 11/30/2014 Elsevier Interactive Patient Education  2017 [employer] Inc.  Increase water intake, strive for at least 100 ounces/day.   Follow Heart Healthy diet Increase regular exercise.  Recommend at least 30 minutes daily, 5 days per week of walking, jogging, biking, swimming, YouTube/Pinterest workout videos. Please start once daily Montelukast 10mg  daily. Please talk to your  OB/GYN about ovarian cysts and perhaps changing oral birth control rx's or IUD to help reduce frequency of symptoms. Follow-up with Neurologist as directed, re: migraine headache prevention/treatment. Please schedule complete physical with fasting labs in the next few months. WELCOME TO THE PRACTICE!

## 2017-09-10 NOTE — Progress Notes (Addendum)
Subjective:    Patient ID: Brooke Mclaughlin, female    DOB: 04/11/1986, 32 y.o.   MRN: [id number]  HPI: Ms. Brooke Mclaughlin is here to establish as a new pt.  She is a pleasant 32 year old female. PMH: Migraine HA (4-5/year), tension HA (1-2/week), Seasonal Allergies, recurrent ovarian cysts, and obesity. She had severe HA Nov 2018- evaluated at ED and referred to Neurology, had OV 06/25/17. Imaging of head showed no acute intracranial abnormalities.  She was treated with methylprednisone and tizandidine.  She has not followed-up with Neurology since then. She has constant nasal drainage and facial pressure, r/t chronic sinusitis not treated well with OTC Loratadine 10mg  daily. She estimates to drink >80 oz water/day when working M-F and tries to follow heart healthy diet. She enjoys walking, and is trying to increase distance/frequency. She denies tobacco use and drinks ETOH only socially. She feels that her severity/frequency of ovarian cyst's have increased since she started new oral BC.  Patient Care Team    Relationship Specialty Notifications Start End  William Hamburger D, NP PCP - General Family Medicine  08/29/17   York Spaniel, MD Consulting Physician Neurology  09/10/17   Trenda Moots Referring Physician Obstetrics and Gynecology  09/10/17     Patient Active Problem List   Diagnosis Date Noted  . Healthcare maintenance 09/10/2017  . Migraines 09/10/2017  . Right ovarian cyst 03/06/2017  . Encounter for surveillance of implantable subdermal contraceptive 01/24/2017  . Lump of thigh 10/24/2014  . Right ear pain 10/24/2014  . Rash and nonspecific skin eruption 10/21/2014  . Lump of skin of back 10/21/2014  . Chest pain 05/28/2013     Past Medical History:  Diagnosis Date  . Frequent headaches   . History of Papanicolaou smear of cervix 12/31/13; 05/01/16   NEG; -/-  . Irregular menses   . Migraines   . Palpitations   . Syncope 2003   has felt that way since but did  not faint  . UTI (lower urinary tract infection)      Past Surgical History:  Procedure Laterality Date  . TONSILECTOMY, ADENOIDECTOMY, BILATERAL MYRINGOTOMY AND TUBES Bilateral      Family History  Problem Relation Age of Onset  . Hypertension Mother        RUNS ON MAT SIDE OF FAMILY  . Migraines Father   . Hypertension Maternal Grandmother   . Heart disease Maternal Grandmother   . Heart attack Maternal Grandmother 28  . Heart disease Maternal Aunt   . Heart attack Maternal Aunt   . Hypertension Maternal Aunt   . Heart disease Maternal Uncle   . Heart attack Maternal Uncle   . Diabetes Maternal Uncle   . Hypertension Maternal Uncle      Social History   Substance and Sexual Activity  Drug Use No     Social History   Substance and Sexual Activity  Alcohol Use Yes  . Alcohol/week: 3.6 oz  . Types: 6 Cans of beer per week     Social History   Tobacco Use  Smoking Status Never Smoker  Smokeless Tobacco Never Used     Outpatient Encounter Medications as of 09/10/2017  Medication Sig  . loratadine (CLARITIN) 10 MG tablet Take 10 mg by mouth daily.  . LO LOESTRIN FE 1 MG-10 MCG / 10 MCG tablet Take 1 tablet by mouth daily.  . montelukast (SINGULAIR) 10 MG tablet Take 1 tablet (10 mg total) by mouth at  bedtime.  . Norethindrone-Ethinyl Estradiol-Fe Biphas (LO LOESTRIN FE) 1 MG-10 MCG / 10 MCG tablet Take 1 tablet by mouth daily.  . [DISCONTINUED] frovatriptan (FROVA) 2.5 MG tablet Take one daily for 5 days  . [DISCONTINUED] methylPREDNISolone (MEDROL DOSEPAK) 4 MG TBPK tablet Take pills once daily together with food (6,5,4,3,2,1)  . [DISCONTINUED] tiZANidine (ZANAFLEX) 4 MG tablet Take 1 tablet (4 mg total) every 6 (six) hours as needed by mouth for muscle spasms.   Facility-Administered Encounter Medications as of 09/10/2017  Medication  . gadopentetate dimeglumine (MAGNEVIST) injection 19 mL    Allergies: Patient has no known allergies.  Body mass  index is 36.63 kg/m.  Blood pressure 114/75, pulse 76, height 5\' 4"  (1.626 m), weight 213 lb 6.4 oz (96.8 kg), last menstrual period 08/22/2017, SpO2 98 %.       Review of Systems  Constitutional: Positive for fatigue. Negative for activity change, appetite change, chills, diaphoresis, fever and unexpected weight change.  HENT: Positive for congestion and postnasal drip.   Eyes: Negative for visual disturbance.  Respiratory: Negative for cough, chest tightness, shortness of breath, wheezing and stridor.   Cardiovascular: Negative for chest pain, palpitations and leg swelling.  Genitourinary: Negative for difficulty urinating.  Musculoskeletal: Negative for arthralgias, back pain, gait problem, joint swelling, myalgias, neck pain and neck stiffness.  Skin: Negative for color change, pallor, rash and wound.  Neurological: Positive for headaches. Negative for dizziness.  Hematological: Does not bruise/bleed easily.  Psychiatric/Behavioral: Negative for decreased concentration, dysphoric mood, hallucinations, self-injury and suicidal ideas. The patient is not nervous/anxious and is not hyperactive.        Objective:   Physical Exam  Constitutional: She is oriented to person, place, and time. She appears well-developed and well-nourished. No distress.  Cardiovascular: Normal rate, regular rhythm, normal heart sounds and intact distal pulses.  No murmur heard. Pulmonary/Chest: Effort normal and breath sounds normal. No respiratory distress. She has no wheezes. She has no rales. She exhibits no tenderness.  Neurological: She is alert and oriented to person, place, and time.  Skin: Skin is warm and dry. No rash noted. She is not diaphoretic. No erythema. No pallor.  Psychiatric: She has a normal mood and affect. Her behavior is normal. Judgment and thought content normal.  Nursing note and vitals reviewed.         Assessment & Plan:   1. Family history of diabetes mellitus (DM)    2. Family history of heart disease in female family member before age 33   3. Fatigue, unspecified type   4. Healthcare maintenance   5. Other migraine with status migrainosus, not intractable     Healthcare maintenance Increase water intake, strive for at least 100 ounces/day.   Follow Heart Healthy diet Increase regular exercise.  Recommend at least 30 minutes daily, 5 days per week of walking, jogging, biking, swimming, YouTube/Pinterest workout videos. Please start once daily Montelukast 10mg  daily. Please talk to your OB/GYN about ovarian cysts and perhaps changing oral birth control rx's or IUD to help reduce frequency of symptoms. Follow-up with Neurologist as directed, re: migraine headache prevention/treatment. Please schedule complete physical with fasting labs in the next few months.  Migraines 06/2017- intractable migraine- evaluated at ED and referred to Neurology Imaging negative for acute intracranial abnormality- treated with methylprednisone and tizandidine.  She has not followed-up with Neurology since then. She estimates 4 migraines/year    FOLLOW-UP:  Return in about 3 months (around 12/09/2017) for CPE, Fasting Labs.

## 2017-09-10 NOTE — Assessment & Plan Note (Signed)
Increase water intake, strive for at least 100 ounces/day.   Follow Heart Healthy diet Increase regular exercise.  Recommend at least 30 minutes daily, 5 days per week of walking, jogging, biking, swimming, YouTube/Pinterest workout videos. Please start once daily Montelukast 10mg  daily. Please talk to your OB/GYN about ovarian cysts and perhaps changing oral birth control rx's or IUD to help reduce frequency of symptoms. Follow-up with Neurologist as directed, re: migraine headache prevention/treatment. Please schedule complete physical with fasting labs in the next few months.

## 2017-09-12 ENCOUNTER — Encounter: Payer: Self-pay | Admitting: Adult Health

## 2017-09-24 ENCOUNTER — Other Ambulatory Visit: Payer: Self-pay

## 2017-09-24 DIAGNOSIS — Z0000 Encounter for general adult medical examination without abnormal findings: Secondary | ICD-10-CM

## 2017-09-25 ENCOUNTER — Other Ambulatory Visit: Payer: BLUE CROSS/BLUE SHIELD

## 2017-09-25 DIAGNOSIS — Z0000 Encounter for general adult medical examination without abnormal findings: Secondary | ICD-10-CM

## 2017-09-26 ENCOUNTER — Other Ambulatory Visit: Payer: Self-pay | Admitting: Adult Health

## 2017-09-26 DIAGNOSIS — E559 Vitamin D deficiency, unspecified: Secondary | ICD-10-CM

## 2017-09-26 LAB — LIPID PANEL
CHOL/HDL RATIO: 3.6 ratio (ref 0.0–4.4)
Cholesterol, Total: 166 mg/dL (ref 100–199)
HDL: 46 mg/dL (ref >39)
LDL Calculated: 88 mg/dL (ref 0–99)
TRIGLYCERIDES: 162 mg/dL — AB (ref 0–149)
VLDL Cholesterol Cal: 32 mg/dL (ref 5–40)

## 2017-09-26 LAB — COMPREHENSIVE METABOLIC PANEL
ALBUMIN: 3.9 g/dL (ref 3.5–5.5)
ALK PHOS: 44 IU/L (ref 39–117)
ALT: 20 IU/L (ref 0–32)
AST: 13 IU/L (ref 0–40)
Albumin/Globulin Ratio: 1.5 (ref 1.2–2.2)
BUN/Creatinine Ratio: 14 (ref 9–23)
BUN: 11 mg/dL (ref 6–20)
Bilirubin Total: 0.3 mg/dL (ref 0.0–1.2)
CO2: 20 mmol/L (ref 20–29)
CREATININE: 0.77 mg/dL (ref 0.57–1.00)
Calcium: 8.9 mg/dL (ref 8.7–10.2)
Chloride: 106 mmol/L (ref 96–106)
GFR calc Af Amer: 118 mL/min/{1.73_m2} (ref >59)
GFR calc non Af Amer: 102 mL/min/{1.73_m2} (ref >59)
GLUCOSE: 92 mg/dL (ref 65–99)
Globulin, Total: 2.6 g/dL (ref 1.5–4.5)
Potassium: 4.4 mmol/L (ref 3.5–5.2)
Sodium: 139 mmol/L (ref 134–144)
Total Protein: 6.5 g/dL (ref 6.0–8.5)

## 2017-09-26 LAB — CBC WITH DIFFERENTIAL/PLATELET
Basophils Absolute: 0 10*3/uL (ref 0.0–0.2)
Basos: 0 %
EOS (ABSOLUTE): 0.1 10*3/uL (ref 0.0–0.4)
EOS: 2 %
HEMATOCRIT: 39.4 % (ref 34.0–46.6)
HEMOGLOBIN: 13 g/dL (ref 11.1–15.9)
IMMATURE GRANULOCYTES: 0 %
Immature Grans (Abs): 0 10*3/uL (ref 0.0–0.1)
Lymphocytes Absolute: 1.7 10*3/uL (ref 0.7–3.1)
Lymphs: 28 %
MCH: 30.4 pg (ref 26.6–33.0)
MCHC: 33 g/dL (ref 31.5–35.7)
MCV: 92 fL (ref 79–97)
MONOCYTES: 5 %
Monocytes Absolute: 0.3 10*3/uL (ref 0.1–0.9)
NEUTROS PCT: 65 %
Neutrophils Absolute: 3.9 10*3/uL (ref 1.4–7.0)
Platelets: 258 10*3/uL (ref 150–379)
RBC: 4.27 x10E6/uL (ref 3.77–5.28)
RDW: 12.7 % (ref 12.3–15.4)
WBC: 6 10*3/uL (ref 3.4–10.8)

## 2017-09-26 LAB — HEMOGLOBIN A1C
Est. average glucose Bld gHb Est-mCnc: 100 mg/dL
HEMOGLOBIN A1C: 5.1 % (ref 4.8–5.6)

## 2017-09-26 LAB — TSH: TSH: 1.87 u[IU]/mL (ref 0.450–4.500)

## 2017-09-26 LAB — VITAMIN D 25 HYDROXY (VIT D DEFICIENCY, FRACTURES): Vit D, 25-Hydroxy: 21.2 ng/mL — ABNORMAL LOW (ref 30.0–100.0)

## 2017-09-26 MED ORDER — VITAMIN D (ERGOCALCIFEROL) 1.25 MG (50000 UNIT) PO CAPS: 50000.0000 [IU] | ORAL_CAPSULE | ORAL | 0 refills | Status: DC

## 2017-09-26 NOTE — Progress Notes (Signed)
Ergocalciferol started

## 2017-09-26 NOTE — Progress Notes (Signed)
Subjective:    Patient ID: Brooke Mclaughlin, female    DOB: Aug 22, 1985, 32 y.o.   MRN: [id number]  HPI: 09/10/17 OV:  Ms. Brooke Mclaughlin is here to establish as a new pt.  She is a pleasant 32 year old female. PMH: Migraine HA ( 4-5/year), tension HA (1-2/week), Seasonal Allergies, recurrent ovarian cysts, and obesity. She had severe HA Nov 2018- evaluated at ED and referred to Neurology, had OV 06/25/17. Imaging of head showed no acute intracranial abnormalities.  She was treated with methylprednisone and tizandidine.  She has not followed-up with Neurology since then. She has constant nasal drainage and facial pressure, r/t chronic sinusitis not treated well with OTC Loratadine 10mg  daily. She estimates to drink >80 oz water/day when working M-F and tries to follow heart healthy diet. She enjoys walking, and is trying to increase distance/frequency. She denies tobacco use and drinks ETOH only socially. She feels that her severity/frequency of ovarian cyst's have increased since she started new oral BC.  10/01/17 OV: Ms. Brooke Mclaughlin is here for CPE.  She only tried Singulair 10mg  once daily for about 1-2 weeks and felt like "it really didn't work" so she stopped it. She has been cycling through OTC antihistamines for several years and feels that her seasonal allergies and food allergies , ie pumpkin and peach have been worsening over the years. She was seen by allergist in adolescence and would like to be seen for evaluation again. She estimates to drink 4 x 16 oz bottle of water and follows heart healthy diet.  She tries to walk most days/week. Reviewed labs at length- vit d def and only small elevated of TGs  Healhtcare Maintenance: PAP- Not due until 09/2018 Mammogram- not indicated  Patient Care Team    Relationship Specialty Notifications Start End  William Hamburger D, NP PCP - General Family Medicine  08/29/17   York Spaniel, MD Consulting Physician Neurology  09/10/17   Trenda Moots Referring Physician Obstetrics and Gynecology  09/10/17     Patient Active Problem List   Diagnosis Date Noted  . Seasonal allergies 10/01/2017  . Vitamin D deficiency 10/01/2017  . Healthcare maintenance 09/10/2017  . Migraines 09/10/2017  . Right ovarian cyst 03/06/2017  . Encounter for surveillance of implantable subdermal contraceptive 01/24/2017  . Lump of thigh 10/24/2014  . Right ear pain 10/24/2014  . Rash and nonspecific skin eruption 10/21/2014  . Lump of skin of back 10/21/2014  . Chest pain 05/28/2013     Past Medical History:  Diagnosis Date  . Frequent headaches   . History of Papanicolaou smear of cervix 12/31/13; 05/01/16   NEG; -/-  . Irregular menses   . Migraines   . Palpitations   . Syncope 2003   has felt that way since but did not faint  . UTI (lower urinary tract infection)      Past Surgical History:  Procedure Laterality Date  . TONSILECTOMY, ADENOIDECTOMY, BILATERAL MYRINGOTOMY AND TUBES Bilateral      Family History  Problem Relation Age of Onset  . Hypertension Mother        RUNS ON MAT SIDE OF FAMILY  . Migraines Father   . Hypertension Maternal Grandmother   . Heart disease Maternal Grandmother   . Heart attack Maternal Grandmother 28  . Heart disease Maternal Aunt   . Heart attack Maternal Aunt   . Hypertension Maternal Aunt   . Heart disease Maternal Uncle   . Heart attack Maternal  Uncle   . Diabetes Maternal Uncle   . Hypertension Maternal Uncle      Social History   Substance and Sexual Activity  Drug Use No     Social History   Substance and Sexual Activity  Alcohol Use Yes  . Alcohol/week: 3.6 oz  . Types: 6 Cans of beer per week     Social History   Tobacco Use  Smoking Status Never Smoker  Smokeless Tobacco Never Used     Outpatient Encounter Medications as of 10/01/2017  Medication Sig  . LO LOESTRIN FE 1 MG-10 MCG / 10 MCG tablet Take 1 tablet by mouth daily.  Marland Kitchen. loratadine (CLARITIN) 10 MG  tablet Take 10 mg by mouth daily.  . Norethindrone-Ethinyl Estradiol-Fe Biphas (LO LOESTRIN FE) 1 MG-10 MCG / 10 MCG tablet Take 1 tablet by mouth daily.  . Vitamin D, Ergocalciferol, (DRISDOL) 50000 units CAPS capsule Take 1 capsule (50,000 Units total) by mouth every 7 (seven) days.  . [DISCONTINUED] montelukast (SINGULAIR) 10 MG tablet Take 1 tablet (10 mg total) by mouth at bedtime.   Facility-Administered Encounter Medications as of 10/01/2017  Medication  . gadopentetate dimeglumine (MAGNEVIST) injection 19 mL    Allergies: Patient has no known allergies.  Body mass index is 36.82 kg/m.  Blood pressure 125/75, pulse 82, height 5' 4.02" (1.626 m), weight 214 lb 9.6 oz (97.3 kg).   Review of Systems  Constitutional: Positive for fatigue. Negative for activity change, appetite change, chills, diaphoresis, fever and unexpected weight change.  HENT: Positive for congestion and postnasal drip.   Eyes: Negative for visual disturbance.  Respiratory: Negative for cough, chest tightness, shortness of breath, wheezing and stridor.   Cardiovascular: Negative for chest pain, palpitations and leg swelling.  Genitourinary: Negative for difficulty urinating.  Musculoskeletal: Negative for arthralgias, back pain, gait problem, joint swelling, myalgias, neck pain and neck stiffness.  Skin: Negative for color change, pallor, rash and wound.  Neurological: Positive for headaches. Negative for dizziness.  Hematological: Does not bruise/bleed easily.  Psychiatric/Behavioral: Negative for decreased concentration, dysphoric mood, hallucinations, self-injury and suicidal ideas. The patient is not nervous/anxious and is not hyperactive.        Objective:   Physical Exam  Constitutional: She is oriented to person, place, and time. She appears well-developed and well-nourished. No distress.  HENT:  Head: Normocephalic and atraumatic.  Right Ear: Hearing, tympanic membrane, external ear and ear canal  normal. Tympanic membrane is not erythematous and not bulging. No decreased hearing is noted.  Left Ear: Hearing, tympanic membrane, external ear and ear canal normal. Tympanic membrane is not erythematous and not bulging. No decreased hearing is noted.  Nose: Nose normal. Right sinus exhibits no maxillary sinus tenderness and no frontal sinus tenderness. Left sinus exhibits no maxillary sinus tenderness and no frontal sinus tenderness.  Mouth/Throat: Uvula is midline, oropharynx is clear and moist and mucous membranes are normal.  Eyes: Conjunctivae are normal. Pupils are equal, round, and reactive to light.  Neck: Normal range of motion. Neck supple.  Cardiovascular: Normal rate, regular rhythm, normal heart sounds and intact distal pulses.  No murmur heard. Pulmonary/Chest: Effort normal and breath sounds normal. No respiratory distress. She has no wheezes. She has no rales. She exhibits no tenderness.  Abdominal: Soft. Bowel sounds are normal. She exhibits no distension and no mass. There is no tenderness. There is no rebound and no guarding.  Musculoskeletal: Normal range of motion.  Lymphadenopathy:    She has no cervical adenopathy.  Neurological: She is alert and oriented to person, place, and time. Coordination normal.  Skin: Skin is warm and dry. No rash noted. She is not diaphoretic. No erythema. No pallor.  Psychiatric: She has a normal mood and affect. Her behavior is normal. Judgment and thought content normal.  Nursing note and vitals reviewed.         Assessment & Plan:   1. Rash and nonspecific skin eruption   2. Healthcare maintenance   3. Seasonal allergies   4. Vitamin D deficiency     Healthcare maintenance Continue excellent water intake and heart healthy diet.  Recommend at least 30 minutes daily, 5 days per week of walking, jogging, biking, swimming, YouTube/Pinterest workout videos. Referral to allergist placed. Since Singular wasn't working, remain off and  further discuss with Allergist. Annual physical with fasting labs.  Rash and nonspecific skin eruption Referral to allergist placed  Seasonal allergies Referral to allergist placed  Vitamin D deficiency Started on once weekly rx vit d last week, will re-check in 4 months after she complete course of therapy    FOLLOW-UP:  Return in about 1 year (around 10/01/2018) for Fasting Labs, CPE.

## 2017-10-01 ENCOUNTER — Encounter: Payer: Self-pay | Admitting: Adult Health

## 2017-10-01 ENCOUNTER — Ambulatory Visit (INDEPENDENT_AMBULATORY_CARE_PROVIDER_SITE_OTHER): Payer: BLUE CROSS/BLUE SHIELD | Admitting: Adult Health

## 2017-10-01 VITALS — BP 125/75 | HR 82 | Ht 64.02 in | Wt 214.6 lb

## 2017-10-01 DIAGNOSIS — J302 Other seasonal allergic rhinitis: Secondary | ICD-10-CM | POA: Diagnosis not present

## 2017-10-01 DIAGNOSIS — Z0000 Encounter for general adult medical examination without abnormal findings: Secondary | ICD-10-CM

## 2017-10-01 DIAGNOSIS — R21 Rash and other nonspecific skin eruption: Secondary | ICD-10-CM | POA: Diagnosis not present

## 2017-10-01 DIAGNOSIS — E559 Vitamin D deficiency, unspecified: Secondary | ICD-10-CM | POA: Diagnosis not present

## 2017-10-01 HISTORY — DX: Other seasonal allergic rhinitis: J30.2

## 2017-10-01 NOTE — Patient Instructions (Addendum)
Heart-Healthy Eating Plan Many factors influence your heart health, including eating and exercise habits. Heart (coronary) risk increases with abnormal blood fat (lipid) levels. Heart-healthy meal planning includes limiting unhealthy fats, increasing healthy fats, and making other small dietary changes. This includes maintaining a healthy body weight to help keep lipid levels within a normal range. What is my plan? Your health care provider recommends that you:  Get no more than _25___% of the total calories in your daily diet from fat.  Limit your intake of saturated fat to less than __5___% of your total calories each day.  Limit the amount of cholesterol in your diet to less than __300__ mg per day.  What types of fat should I choose?  Choose healthy fats more often. Choose monounsaturated and polyunsaturated fats, such as olive oil and canola oil, flaxseeds, walnuts, almonds, and seeds.  Eat more omega-3 fats. Good choices include salmon, mackerel, sardines, tuna, flaxseed oil, and ground flaxseeds. Aim to eat fish at least two times each week.  Limit saturated fats. Saturated fats are primarily found in animal products, such as meats, butter, and cream. Plant sources of saturated fats include palm oil, palm kernel oil, and coconut oil.  Avoid foods with partially hydrogenated oils in them. These contain trans fats. Examples of foods that contain trans fats are stick margarine, some tub margarines, cookies, crackers, and other baked goods. What general guidelines do I need to follow?  Check food labels carefully to identify foods with trans fats or high amounts of saturated fat.  Fill one half of your plate with vegetables and green salads. Eat 4-5 servings of vegetables per day. A serving of vegetables equals 1 cup of raw leafy vegetables,  cup of raw or cooked cut-up vegetables, or  cup of vegetable juice.  Fill one fourth of your plate with whole grains. Look for the word "whole"  as the first word in the ingredient list.  Fill one fourth of your plate with lean protein foods.  Eat 4-5 servings of fruit per day. A serving of fruit equals one medium whole fruit,  cup of dried fruit,  cup of fresh, frozen, or canned fruit, or  cup of 100% fruit juice.  Eat more foods that contain soluble fiber. Examples of foods that contain this type of fiber are apples, broccoli, carrots, beans, peas, and barley. Aim to get 20-30 g of fiber per day.  Eat more home-cooked food and less restaurant, buffet, and fast food.  Limit or avoid alcohol.  Limit foods that are high in starch and sugar.  Avoid fried foods.  Cook foods by using methods other than frying. Baking, boiling, grilling, and broiling are all great options. Other fat-reducing suggestions include: ? Removing the skin from poultry. ? Removing all visible fats from meats. ? Skimming the fat off of stews, soups, and gravies before serving them. ? Steaming vegetables in water or broth.  Lose weight if you are overweight. Losing just 5-10% of your initial body weight can help your overall health and prevent diseases such as diabetes and heart disease.  Increase your consumption of nuts, legumes, and seeds to 4-5 servings per week. One serving of dried beans or legumes equals  cup after being cooked, one serving of nuts equals 1 ounces, and one serving of seeds equals  ounce or 1 tablespoon.  You may need to monitor your salt (sodium) intake, especially if you have high blood pressure. Talk with your health care provider or dietitian to get  more information about reducing sodium. What foods can I eat? Grains  Breads, including [place], white, pita, wheat, raisin, rye, oatmeal, and [place]. Tortillas that are neither fried nor made with lard or trans fat. Low-fat rolls, including hotdog and hamburger buns and English muffins. Biscuits. Muffins. Waffles. Pancakes. Light popcorn. Whole-grain cereals. Flatbread. Melba  toast. Pretzels. Breadsticks. Rusks. Low-fat snacks and crackers, including oyster, saltine, matzo, graham, animal, and rye. Rice and pasta, including brown rice and those that are made with whole wheat. Vegetables All vegetables. Fruits All fruits, but limit coconut. Meats and Other Protein Sources Lean, well-trimmed beef, veal, pork, and lamb. Chicken and [place] without skin. All fish and shellfish. Wild duck, rabbit, pheasant, and venison. Egg whites or low-cholesterol egg substitutes. Dried beans, peas, lentils, and tofu.Seeds and most nuts. Dairy Low-fat or nonfat cheeses, including ricotta, string, and mozzarella. Skim or 1% milk that is liquid, powdered, or evaporated. Buttermilk that is made with low-fat milk. Nonfat or low-fat yogurt. Beverages Mineral water. Diet carbonated beverages. Sweets and Desserts Sherbets and fruit ices. Honey, jam, marmalade, jelly, and syrups. Meringues and gelatins. Pure sugar candy, such as hard candy, jelly beans, gumdrops, mints, marshmallows, and small amounts of dark chocolate. [employer] Eat all sweets and desserts in moderation. Fats and Oils Nonhydrogenated (trans-free) margarines. Vegetable oils, including soybean, sesame, sunflower, olive, peanut, safflower, corn, canola, and cottonseed. Salad dressings or mayonnaise that are made with a vegetable oil. Limit added fats and oils that you use for cooking, baking, salads, and as spreads. Other Cocoa powder. Coffee and tea. All seasonings and condiments. The items listed above may not be a complete list of recommended foods or beverages. Contact your dietitian for more options. What foods are not recommended? Grains Breads that are made with saturated or trans fats, oils, or whole milk. Croissants. Butter rolls. Cheese breads. Sweet rolls. Donuts. Buttered popcorn. Chow mein noodles. High-fat crackers, such as cheese or butter crackers. Meats and Other Protein Sources Fatty meats, such as  hotdogs, short ribs, sausage, spareribs, bacon, ribeye roast or steak, and mutton. High-fat deli meats, such as salami and bologna. Caviar. Domestic duck and goose. Organ meats, such as kidney, liver, sweetbreads, brains, gizzard, chitterlings, and heart. Dairy Cream, sour cream, cream cheese, and creamed cottage cheese. Whole milk cheeses, including blue (bleu), Monterey Jack, [place], [place], American, [place], Swiss, [place], [place], and [place]. Whole or 2% milk that is liquid, evaporated, or condensed. Whole buttermilk. Cream sauce or high-fat cheese sauce. Yogurt that is made from whole milk. Beverages Regular sodas and drinks with added sugar. Sweets and Desserts Frosting. Pudding. Cookies. Cakes other than angel food cake. Candy that has milk chocolate or white chocolate, hydrogenated fat, butter, coconut, or unknown ingredients. Buttered syrups. Full-fat ice cream or ice cream drinks. Fats and Oils Gravy that has suet, meat fat, or shortening. Cocoa butter, hydrogenated oils, palm oil, coconut oil, palm kernel oil. These can often be found in baked products, candy, fried foods, nondairy creamers, and whipped toppings. Solid fats and shortenings, including bacon fat, salt pork, lard, and butter. Nondairy cream substitutes, such as coffee creamers and sour cream substitutes. Salad dressings that are made of unknown oils, cheese, or sour cream. The items listed above may not be a complete list of foods and beverages to avoid. Contact your dietitian for more information. This information is not intended to replace advice given to you by your health care provider. Make sure you discuss any questions you have with your health care  provider. Document Released: 05/16/2008 Document Revised: 02/25/2016 Document Reviewed: 01/29/2014 Elsevier Interactive Patient Education  2018 [employer].   Allergic Rhinitis, Adult Allergic rhinitis is an allergic reaction that affects the mucous membrane inside the  nose. It causes sneezing, a runny or stuffy nose, and the feeling of mucus going down the back of the throat (postnasal drip). Allergic rhinitis can be mild to severe. There are two types of allergic rhinitis:  Seasonal. This type is also called hay fever. It happens only during certain seasons.  Perennial. This type can happen at any time of the year.  What are the causes? This condition happens when the body's defense system (immune system) responds to certain harmless substances called allergens as though they were germs.  Seasonal allergic rhinitis is triggered by pollen, which can come from grasses, trees, and weeds. Perennial allergic rhinitis may be caused by:  House dust mites.  Pet dander.  Mold spores.  What are the signs or symptoms? Symptoms of this condition include:  Sneezing.  Runny or stuffy nose (nasal congestion).  Postnasal drip.  Itchy nose.  Tearing of the eyes.  Trouble sleeping.  Daytime sleepiness.  How is this diagnosed? This condition may be diagnosed based on:  Your medical history.  A physical exam.  Tests to check for related conditions, such as: ? Asthma. ? Pink eye. ? Ear infection. ? Upper respiratory infection.  Tests to find out which allergens trigger your symptoms. These may include skin or blood tests.  How is this treated? There is no cure for this condition, but treatment can help control symptoms. Treatment may include:  Taking medicines that block allergy symptoms, such as antihistamines. Medicine may be given as a shot, nasal spray, or pill.  Avoiding the allergen.  Desensitization. This treatment involves getting ongoing shots until your body becomes less sensitive to the allergen. This treatment may be done if other treatments do not help.  If taking medicine and avoiding the allergen does not work, new, stronger medicines may be prescribed.  Follow these instructions at home:  Find out what you are allergic to.  Common allergens include smoke, dust, and pollen.  Avoid the things you are allergic to. These are some things you can do to help avoid allergens: ? Replace carpet with wood, tile, or vinyl flooring. Carpet can trap dander and dust. ? Do not smoke. Do not allow smoking in your home. ? Change your heating and air conditioning filter at least once a month. ? During allergy season:  Keep windows closed as much as possible.  Plan outdoor activities when pollen counts are lowest. This is usually during the evening hours.  When coming indoors, change clothing and shower before sitting on furniture or bedding.  Take over-the-counter and prescription medicines only as told by your health care provider.  Keep all follow-up visits as told by your health care provider. This is important. Contact a health care provider if:  You have a fever.  You develop a persistent cough.  You make whistling sounds when you breathe (you wheeze).  Your symptoms interfere with your normal daily activities. Get help right away if:  You have shortness of breath. Summary  This condition can be managed by taking medicines as directed and avoiding allergens.  Contact your health care provider if you develop a persistent cough or fever.  During allergy season, keep windows closed as much as possible. This information is not intended to replace advice given to you by your health  care provider. Make sure you discuss any questions you have with your health care provider. Document Released: 05/02/2001 Document Revised: 09/14/2016 Document Reviewed: 09/14/2016 Elsevier Interactive Patient Education  2018 [employer]   Continue excellent water intake and heart healthy diet.  Recommend at least 30 minutes daily, 5 days per week of walking, jogging, biking, swimming, YouTube/Pinterest workout videos. Referral to allergist placed. Since Singular wasn't working, remain off and further discuss with  Allergist. Annual physical with fasting labs. NICE TO SEE YOU!

## 2017-10-01 NOTE — Assessment & Plan Note (Signed)
Referral to allergist placed.

## 2017-10-01 NOTE — Assessment & Plan Note (Signed)
Continue excellent water intake and heart healthy diet.  Recommend at least 30 minutes daily, 5 days per week of walking, jogging, biking, swimming, YouTube/Pinterest workout videos. Referral to allergist placed. Since Singular wasn't working, remain off and further discuss with Allergist. Annual physical with fasting labs.

## 2017-10-01 NOTE — Assessment & Plan Note (Signed)
Started on once weekly rx vit d last week, will re-check in 4 months after she complete course of therapy

## 2017-11-08 ENCOUNTER — Encounter: Payer: Self-pay | Admitting: Adult Health

## 2017-11-08 ENCOUNTER — Ambulatory Visit: Payer: BLUE CROSS/BLUE SHIELD | Admitting: Allergy & Immunology

## 2017-11-08 ENCOUNTER — Encounter: Payer: Self-pay | Admitting: Allergy & Immunology

## 2017-11-08 VITALS — BP 134/74 | HR 88 | Temp 98.3°F | Resp 16 | Ht 64.0 in | Wt 215.0 lb

## 2017-11-08 DIAGNOSIS — J3089 Other allergic rhinitis: Secondary | ICD-10-CM | POA: Diagnosis not present

## 2017-11-08 DIAGNOSIS — L2089 Other atopic dermatitis: Secondary | ICD-10-CM

## 2017-11-08 DIAGNOSIS — J302 Other seasonal allergic rhinitis: Secondary | ICD-10-CM | POA: Diagnosis not present

## 2017-11-08 MED ORDER — TRIAMCINOLONE ACETONIDE 0.1 % EX OINT: 1.0000 "application " | TOPICAL_OINTMENT | Freq: Two times a day (BID) | CUTANEOUS | 3 refills | Status: DC

## 2017-11-08 MED ORDER — FLUTICASONE PROPIONATE 93 MCG/ACT NA EXHU: 2.0000 | INHALANT_SUSPENSION | Freq: Two times a day (BID) | NASAL | 5 refills | Status: DC

## 2017-11-08 MED ORDER — OLOPATADINE HCL 0.7 % OP SOLN: 1.0000 [drp] | Freq: Every day | OPHTHALMIC | 5 refills | Status: DC

## 2017-11-08 NOTE — Patient Instructions (Addendum)
1. Seasonal and perennial allergic rhinitis - Testing today showed: horse, mouse, weeds, grasses, indoor molds, outdoor molds, dust mites, cat and dog - Avoidance measures provided. - Continue with: Claritin (loratadine) 10mg  tablet once daily - Start taking: Xhance (fluticasone) 1-2 sprays per nostril twice daily and Pazeo (olopatadine) one drop per eye daily as needed - You can use an extra dose of the antihistamine, if needed, for breakthrough symptoms.  - Consider nasal saline rinses 1-2 times daily to remove allergens from the nasal cavities as well as help with mucous clearance (this is especially helpful to do before the nasal sprays are given) - Consider allergy shots as a means of long-term control. - Allergy shots "re-train" and "reset" the immune system to ignore environmental allergens and decrease the resulting immune response to thos3 allergens (sneezing, itchy watery eyes, runny nose, nasal congestion, etc).    - Allergy shots improve symptoms in 75-85% of patients.  - We can discuss more at the next appointment if the medications are not working for you.  2. Eczema - Continue with moisturizing twice daily to maintain the skin barrier. - Add on triamcinolone ointment 0.1% twice daily as needed to the worst most irritated areas. - Testing to the most common foods was negative, therefore there does not seem to be food trigger for your symptoms.   3. Return in about 3 months (around 02/08/2018).  Please inform us of any Emergency Department visits, hospitalizations, or changes in symptoms. Call us before going to the ED for breathing or allergy symptoms since we might be able to fit you in for a sick visit. Feel free to contact us anytime with any questions, problems, or concerns.  It was a pleasure to meet you today!  Websites that have reliable patient information: 1. American Academy of Asthma, Allergy, and Immunology: www.aaaai.org 2. Food Allergy Research and Education  (FARE): foodallergy.org 3. Mothers of Asthmatics: http://www.asthmacommunitynetwork.org 4. American College of Allergy, Asthma, and Immunology: www.acaai.org   Reducing Pollen Exposure  The American Academy of Allergy, Asthma and Immunology suggests the following steps to reduce your exposure to pollen during allergy seasons.    1. Do not hang sheets or clothing out to dry; pollen may collect on these items. 2. Do not mow lawns or spend time around freshly cut grass; mowing stirs up pollen. 3. Keep windows closed at night.  Keep car windows closed while driving. 4. Minimize morning activities outdoors, a time when pollen counts are usually at their highest. 5. Stay indoors as much as possible when pollen counts or humidity is high and on windy days when pollen tends to remain in the air longer. 6. Use air conditioning when possible.  Many air conditioners have filters that trap the pollen spores. 7. Use a HEPA room air filter to remove pollen form the indoor air you breathe.  Control of Mold Allergen   Mold and fungi can grow on a variety of surfaces provided certain temperature and moisture conditions exist.  Outdoor molds grow on plants, decaying vegetation and soil.  The major outdoor mold, Alternaria and Cladosporium, are found in very high numbers during hot and dry conditions.  Generally, a late Summer - Fall peak is seen for common outdoor fungal spores.  Rain will temporarily lower outdoor mold spore count, but counts rise rapidly when the rainy period ends.  The most important indoor molds are Aspergillus and Penicillium.  Dark, humid and poorly ventilated basements are ideal sites for mold growth.  The next  most common sites of mold growth are the bathroom and the kitchen.  Outdoor (Seasonal) Mold Control  Positive outdoor molds via skin testing: Drechslera (Curvalaria) and Epicoccum  1. Use air conditioning and keep windows closed 2. Avoid exposure to decaying  vegetation. 3. Avoid leaf raking. 4. Avoid grain handling. 5. Consider wearing a face mask if working in moldy areas.  6.   Indoor (Perennial) Mold Control   Positive indoor molds via skin testing: Aspergillus and Penicillium  1. Maintain humidity below 50%. 2. Clean washable surfaces with 5% bleach solution. 3. Remove sources e.g. contaminated carpets.     Control of House Dust Mite Allergen    House dust mites play a major role in allergic asthma and rhinitis.  They occur in environments with high humidity wherever human skin, the food for dust mites is found. High levels have been detected in dust obtained from mattresses, pillows, carpets, upholstered furniture, bed covers, clothes and soft toys.  The principal allergen of the house dust mite is found in its feces.  A gram of dust may contain 1,000 mites and 250,000 fecal particles.  Mite antigen is easily measured in the air during house cleaning activities.    1. Encase mattresses, including the box spring, and pillow, in an air tight cover.  Seal the zipper end of the encased mattresses with wide adhesive tape. 2. Wash the bedding in water of 130 degrees Farenheit weekly.  Avoid cotton comforters/quilts and flannel bedding: the most ideal bed covering is the dacron comforter. 3. Remove all upholstered furniture from the bedroom. 4. Remove carpets, carpet padding, rugs, and non-washable window drapes from the bedroom.  Wash drapes weekly or use plastic window coverings. 5. Remove all non-washable stuffed toys from the bedroom.  Wash stuffed toys weekly. 6. Have the room cleaned frequently with a vacuum cleaner and a damp dust-mop.  The patient should not be in a room which is being cleaned and should wait 1 hour after cleaning before going into the room. 7. Close and seal all heating outlets in the bedroom.  Otherwise, the room will become filled with dust-laden air.  An electric heater can be used to heat the room. 8. Reduce  indoor humidity to less than 50%.  Do not use a humidifier.  Control of Dog or Cat Allergen  Avoidance is the best way to manage a dog or cat allergy. If you have a dog or cat and are allergic to dog or cats, consider removing the dog or cat from the home. If you have a dog or cat but don't want to find it a new home, or if your family wants a pet even though someone in the household is allergic, here are some strategies that may help keep symptoms at bay:  1. Keep the pet out of your bedroom and restrict it to only a few rooms. Be advised that keeping the dog or cat in only one room will not limit the allergens to that room. 2. Don't pet, hug or kiss the dog or cat; if you do, wash your hands with soap and water. 3. High-efficiency particulate air (HEPA) cleaners run continuously in a bedroom or living room can reduce allergen levels over time. 4. Regular use of a high-efficiency vacuum cleaner or a central vacuum can reduce allergen levels. 5. Giving your dog or cat a bath at least once a week can reduce airborne allergen.  Allergy Shots   Allergies are the result of a chain reaction that  starts in the immune system. Your immune system controls how your body defends itself. For instance, if you have an allergy to pollen, your immune system identifies pollen as an invader or allergen. Your immune system overreacts by producing antibodies called Immunoglobulin E (IgE). These antibodies travel to cells that release chemicals, causing an allergic reaction.  The concept behind allergy immunotherapy, whether it is received in the form of shots or tablets, is that the immune system can be desensitized to specific allergens that trigger allergy symptoms. Although it requires time and patience, the payback can be long-term relief.  How Do Allergy Shots Work?  Allergy shots work much like a vaccine. Your body responds to injected amounts of a particular allergen given in increasing doses, eventually  developing a resistance and tolerance to it. Allergy shots can lead to decreased, minimal or no allergy symptoms.  There generally are two phases: build-up and maintenance. Build-up often ranges from three to six months and involves receiving injections with increasing amounts of the allergens. The shots are typically given once or twice a week, though more rapid build-up schedules are sometimes used.  The maintenance phase begins when the most effective dose is reached. This dose is different for each person, depending on how allergic you are and your response to the build-up injections. Once the maintenance dose is reached, there are longer periods between injections, typically two to four weeks.  Occasionally doctors give cortisone-type shots that can temporarily reduce allergy symptoms. These types of shots are different and should not be confused with allergy immunotherapy shots.  Who Can Be Treated with Allergy Shots?  Allergy shots may be a good treatment approach for people with allergic rhinitis (hay fever), allergic asthma, conjunctivitis (eye allergy) or stinging insect allergy.   Before deciding to begin allergy shots, you should consider:  . The length of allergy season and the severity of your symptoms . Whether medications and/or changes to your environment can control your symptoms . Your desire to avoid long-term medication use . Time: allergy immunotherapy requires a major time commitment . Cost: may vary depending on your insurance coverage  Allergy shots for children age 44 and older are effective and often well tolerated. They might prevent the onset of new allergen sensitivities or the progression to asthma.  Allergy shots are not started on patients who are pregnant but can be continued on patients who become pregnant while receiving them. In some patients with other medical conditions or who take certain common medications, allergy shots may be of risk. It is important  to mention other medications you talk to your allergist.   When Will I Feel Better?  Some may experience decreased allergy symptoms during the build-up phase. For others, it may take as long as 12 months on the maintenance dose. If there is no improvement after a year of maintenance, your allergist will discuss other treatment options with you.  If you aren't responding to allergy shots, it may be because there is not enough dose of the allergen in your vaccine or there are missing allergens that were not identified during your allergy testing. Other reasons could be that there are high levels of the allergen in your environment or major exposure to non-allergic triggers like tobacco smoke.  What Is the Length of Treatment?  Once the maintenance dose is reached, allergy shots are generally continued for three to five years. The decision to stop should be discussed with your allergist at that time. Some people may experience  a permanent reduction of allergy symptoms. Others may relapse and a longer course of allergy shots can be considered.  What Are the Possible Reactions?  The two types of adverse reactions that can occur with allergy shots are local and systemic. Common local reactions include very mild redness and swelling at the injection site, which can happen immediately or several hours after. A systemic reaction, which is less common, affects the entire body or a particular body system. They are usually mild and typically respond quickly to medications. Signs include increased allergy symptoms such as sneezing, a stuffy nose or hives.  Rarely, a serious systemic reaction called anaphylaxis can develop. Symptoms include swelling in the throat, wheezing, a feeling of tightness in the chest, nausea or dizziness. Most serious systemic reactions develop within 30 minutes of allergy shots. This is why it is strongly recommended you wait in your doctor's office for 30 minutes after your injections.  Your allergist is trained to watch for reactions, and his or her staff is trained and equipped with the proper medications to identify and treat them.  Who Should Administer Allergy Shots?  The preferred location for receiving shots is your prescribing allergist's office. Injections can sometimes be given at another facility where the physician and staff are trained to recognize and treat reactions, and have received instructions by your prescribing allergist.

## 2017-11-08 NOTE — Progress Notes (Signed)
NEW PATIENT  Date of Service/Encounter:  11/08/17  Referring provider: Julaine Fusi, NP   Assessment:   Seasonal and perennial allergic rhinitis (horse, mouse, weeds, grasses, indoor molds, outdoor molds, dust mites, cat and dog)  Eczema  Plan/Recommendations:   1. Seasonal and perennial allergic rhinitis - Testing today showed: horse, mouse, weeds, grasses, indoor molds, outdoor molds, dust mites, cat and dog - Avoidance measures provided. - Continue with: Claritin (loratadine) 10mg  tablet once daily - Start taking: Xhance (fluticasone) 1-2 sprays per nostril twice daily and Pazeo (olopatadine) one drop per eye daily as needed - You can use an extra dose of the antihistamine, if needed, for breakthrough symptoms.  - Consider nasal saline rinses 1-2 times daily to remove allergens from the nasal cavities as well as help with mucous clearance (this is especially helpful to do before the nasal sprays are given) - Consider allergy shots as a means of long-term control. - Allergy shots "re-train" and "reset" the immune system to ignore environmental allergens and decrease the resulting immune response to thos3 allergens (sneezing, itchy watery eyes, runny nose, nasal congestion, etc).    - Allergy shots improve symptoms in 75-85% of patients.  - We can discuss more at the next appointment if the medications are not working for you.  2. Eczema - Continue with moisturizing twice daily to maintain the skin barrier. - Add on triamcinolone ointment 0.1% twice daily as needed to the worst most irritated areas. - Testing to the most common foods was negative, therefore there does not seem to be food trigger for your symptoms.  - There is a the low positive predictive value of food allergy testing and hence the high possibility of false positives. - In contrast, food allergy testing has a high negative predictive value, therefore if testing is negative we can be relatively assured that  they are indeed negative.   3. Return in about 3 months (around 02/08/2018).  Subjective:   Brooke Mclaughlin is a 32 y.o. female presenting today for evaluation of  Chief Complaint  Patient presents with  . Allergies    sneezing, PND, runny nose  . Eye Problem    itchy, watery eyes  . Eczema  . migraines    Brooke Mclaughlin has a history of the following: Patient Active Problem List   Diagnosis Date Noted  . Seasonal allergies 10/01/2017  . Vitamin D deficiency 10/01/2017  . Healthcare maintenance 09/10/2017  . Migraines 09/10/2017  . Right ovarian cyst 03/06/2017  . Encounter for surveillance of implantable subdermal contraceptive 01/24/2017  . Lump of thigh 10/24/2014  . Right ear pain 10/24/2014  . Rash and nonspecific skin eruption 10/21/2014  . Lump of skin of back 10/21/2014  . Chest pain 05/28/2013    History obtained from: chart review and patient.  Brooke Mclaughlin was referred by Julaine Fusi, NP.     Brooke Mclaughlin is a 32 y.o. female presenting for an evaluation of allergic rhinitis. She tells me today that she has had allergic rhinitis symptoms for as long as she can remember. She sneezes constantly. She has symptoms throughout the year. She does endorse headaches which she feel might be related to allergies. She denies sinus infections. She does have eye itching, which does worsen around the animals. It also gets worse when she is at work [employer] for [employer]). She has been there for six years. She does not remember being tested at all.  Currently she is taking Claritin for her allergies. She will sometimes takes Zyrtec or Allegra. Claritin works just as well as Scientist, research (medical)Zyrtec or Allegra. She did get a script for montelukast and it did not work at all. She has been a nose spray in the distant past but not currently. She has never been on allergy shots. She does develop urticaria with exposure to cats and dogs.   She denies sinus  infections. There is no asthma. She does consume dairy without problems. She did touch a pumpkin once and broke out in a rash that lasted one week. She did eat a spoonful of peach cobbler and the next day her face became a rash. She does not eat peaches at this point. She tolerates, egg, wheat, seafood without problems.   Otherwise, there is no history of other atopic diseases, including asthma, drug allergies, food allergies, environmental allergies, stinging insect allergies, or urticaria. There is no significant infectious history. Vaccinations are up to date.    Past Medical History: Patient Active Problem List   Diagnosis Date Noted  . Seasonal allergies 10/01/2017  . Vitamin D deficiency 10/01/2017  . Healthcare maintenance 09/10/2017  . Migraines 09/10/2017  . Right ovarian cyst 03/06/2017  . Encounter for surveillance of implantable subdermal contraceptive 01/24/2017  . Lump of thigh 10/24/2014  . Right ear pain 10/24/2014  . Rash and nonspecific skin eruption 10/21/2014  . Lump of skin of back 10/21/2014  . Chest pain 05/28/2013    Medication List:  Allergies as of 11/08/2017   No Known Allergies     Medication List        Accurate as of 11/08/17  9:15 PM. Always use your most recent med list.          Fluticasone Propionate 93 MCG/ACT Exhu Commonly known as:  XHANCE Place 2 sprays into the nose 2 (two) times daily.   loratadine 10 MG tablet Commonly known as:  CLARITIN Take 10 mg by mouth daily.   multivitamin tablet Take 1 tablet by mouth daily.   Norethindrone-Ethinyl Estradiol-Fe Biphas 1 MG-10 MCG / 10 MCG tablet Commonly known as:  LO LOESTRIN FE Take 1 tablet by mouth daily.   LO LOESTRIN FE 1 MG-10 MCG / 10 MCG tablet Generic drug:  Norethindrone-Ethinyl Estradiol-Fe Biphas Take 1 tablet by mouth daily.   Olopatadine HCl 0.7 % Soln Commonly known as:  PAZEO Place 1 drop into both eyes daily.   triamcinolone ointment 0.1 % Commonly known as:   KENALOG Apply 1 application topically 2 (two) times daily.   Vitamin D (Ergocalciferol) 50000 units Caps capsule Commonly known as:  DRISDOL Take 1 capsule (50,000 Units total) by mouth every 7 (seven) days.       Birth History: non-contributory. She did have jaundice when she was born.   Developmental History: non-contributory.   Past Surgical History: Past Surgical History:  Procedure Laterality Date  . ADENOIDECTOMY    . TONSILECTOMY, ADENOIDECTOMY, BILATERAL MYRINGOTOMY AND TUBES Bilateral   . TYMPANOSTOMY TUBE PLACEMENT       Family History: Family History  Problem Relation Age of Onset  . Hypertension Mother        RUNS ON MAT SIDE OF FAMILY  . Migraines Father   . Hypertension Maternal Grandmother   . Heart disease Maternal Grandmother   . Heart attack Maternal Grandmother 28  . Heart disease Maternal Aunt   . Heart attack Maternal Aunt   . Hypertension Maternal Aunt   . Heart disease  Maternal Uncle   . Heart attack Maternal Uncle   . Diabetes Maternal Uncle   . Hypertension Maternal Uncle      Social History: Ashrita lives at home with her boyfriend, his 11yo son, and her 7yo son. They have 10 dogs in total as well as one cat. They live in a house that was built in 1999. There is wood flooring throughout. They have gas and electric heating as well as central cooling. There are no dust mite coverings on the bedding and no tobacco exposures in the home. She is currently working as a Engineer, water at [employer].     Review of Systems: a 14-point review of systems is pertinent for what is mentioned in HPI.  Otherwise, all other systems were negative. Constitutional: negative other than that listed in the HPI Eyes: negative other than that listed in the HPI Ears, nose, mouth, throat, and face: negative other than that listed in the HPI Respiratory: negative other than that listed in the HPI Cardiovascular: negative other than that listed in the  HPI Gastrointestinal: negative other than that listed in the HPI Genitourinary: negative other than that listed in the HPI Integument: negative other than that listed in the HPI Hematologic: negative other than that listed in the HPI Musculoskeletal: negative other than that listed in the HPI Neurological: negative other than that listed in the HPI Allergy/Immunologic: negative other than that listed in the HPI    Objective:   Blood pressure 134/74, pulse 88, temperature 98.3 F (36.8 C), temperature source Oral, resp. rate 16, height 5\' 4"  (1.626 m), weight 215 lb (97.5 kg). Body mass index is 36.9 kg/m.   Physical Exam:  General: Alert, interactive, in no acute distress. Very pleasant. Eyes: No conjunctival injection bilaterally, no discharge on the right, no discharge on the left, no Horner-Trantas dots present and allergic shiners present bilaterally. PERRL bilaterally. EOMI without pain. No photophobia.  Ears: Right TM pearly gray with normal light reflex, Left TM pearly gray with normal light reflex, Right TM intact without perforation and Left TM intact without perforation.  Nose/Throat: External nose within normal limits, nasal crease present and septum midline. Turbinates edematous and pale with clear discharge. Posterior oropharynx markedly erythematous without cobblestoning in the posterior oropharynx. Tonsils 2+ without exudates.  Tongue without thrush. Neck: Supple without thyromegaly. Trachea midline. Adenopathy: shoddy bilateral anterior cervical lymphadenopathy and no enlarged lymph nodes appreciated in the occipital, axillary, epitrochlear, inguinal, or popliteal regions. Lungs: Clear to auscultation without wheezing, rhonchi or rales. No increased work of breathing. CV: Normal S1/S2. No murmurs. Capillary refill <2 seconds.  Abdomen: Nondistended, nontender. No guarding or rebound tenderness. Bowel sounds present in all fields and hyperactive  Skin: Warm and dry,  without lesions or rashes. Extremities:  No clubbing, cyanosis or edema. Neuro:   Grossly intact. No focal deficits appreciated. Responsive to questions.  Diagnostic studies:    Allergy Studies:   Indoor/Outdoor Percutaneous Adult Environmental Panel: positive to bahia grass, [place] grass, [place] blue grass, meadow fescue grass, perennial rye grass, sweet vernal grass, timothy grass, cocklebur, burweed marsh elder, short ragweed, Drechslera, epicoccum, Dp mites, cat, dog, horse and mouse. Otherwise negative with adequate controls.  Indoor/Outdoor Selected Intradermal Environmental Panel: positive to mold mix #2. Otherwise negative with adequate controls.    Allergy testing results were read and interpreted by myself, documented by clinical staff.     Malachi Bonds, MD [facility]

## 2017-11-12 ENCOUNTER — Encounter: Payer: Self-pay | Admitting: Adult Health

## 2017-11-19 ENCOUNTER — Telehealth: Payer: Self-pay | Admitting: Allergy

## 2017-11-19 NOTE — Telephone Encounter (Signed)
Left message for patient to call back about other noses sprays she has used.

## 2017-11-28 MED ORDER — FLUTICASONE PROPIONATE 93 MCG/ACT NA EXHU: 2.0000 | INHALANT_SUSPENSION | Freq: Two times a day (BID) | NASAL | 12 refills | Status: DC

## 2017-11-28 NOTE — Telephone Encounter (Signed)
Called patient to get update on status of Xhance Rx. Patient did receive 2 boxes of Xhance at no cost. [facility]. [facility] stating they DO NOT process prior uthorizations for the medication being requested Brooke Mclaughlin(XHANCE). Need to send in Rx for 12 month refills to [facility].  Resent in Rx.  Patient notified.

## 2017-12-12 ENCOUNTER — Telehealth: Payer: Self-pay | Admitting: Allergy

## 2017-12-12 NOTE — Telephone Encounter (Signed)
[employer] called and said patient has [employer] [place]. Need to get them to file Tier exception form.

## 2018-01-01 NOTE — Telephone Encounter (Signed)
Left message for patient to call office.  

## 2018-01-02 NOTE — Telephone Encounter (Signed)
Called [name].  They would prescribe Xhance for patient as her speciality pharmacy.  A test claim was run.  [name] is a non-formulary medication and is not covered.  Patient will have to use an alternative drug:  Fluticasone is covered. Spoke with patient.  Informed of formulary coverage determination via her insurance.  She will buy Flonase OTC.

## 2018-01-09 ENCOUNTER — Telehealth: Payer: Self-pay | Admitting: Allergy

## 2018-01-09 NOTE — Telephone Encounter (Signed)
Brooke Mclaughlin has been cancelled.Can't get approve.

## 2018-01-25 ENCOUNTER — Other Ambulatory Visit: Payer: Self-pay | Admitting: Adult Health

## 2018-02-14 ENCOUNTER — Ambulatory Visit: Payer: BLUE CROSS/BLUE SHIELD | Admitting: Allergy & Immunology

## 2018-02-23 ENCOUNTER — Other Ambulatory Visit: Payer: Self-pay | Admitting: Obstetrics & Gynecology

## 2018-03-27 ENCOUNTER — Other Ambulatory Visit: Payer: Self-pay | Admitting: Obstetrics & Gynecology

## 2018-04-19 ENCOUNTER — Other Ambulatory Visit: Payer: Self-pay | Admitting: Obstetrics & Gynecology

## 2018-04-19 NOTE — Telephone Encounter (Signed)
Called and left voice mail for patient to call back to be schedule °

## 2018-04-19 NOTE — Telephone Encounter (Signed)
Sch Annual.  Rx renewed til then.

## 2018-05-01 ENCOUNTER — Ambulatory Visit (INDEPENDENT_AMBULATORY_CARE_PROVIDER_SITE_OTHER): Payer: BLUE CROSS/BLUE SHIELD | Admitting: Obstetrics & Gynecology

## 2018-05-01 ENCOUNTER — Encounter: Payer: Self-pay | Admitting: Obstetrics & Gynecology

## 2018-05-01 VITALS — BP 130/80 | Ht 64.0 in | Wt 207.0 lb

## 2018-05-01 DIAGNOSIS — Z0000 Encounter for general adult medical examination without abnormal findings: Secondary | ICD-10-CM

## 2018-05-01 DIAGNOSIS — Z01411 Encounter for gynecological examination (general) (routine) with abnormal findings: Secondary | ICD-10-CM

## 2018-05-01 DIAGNOSIS — N926 Irregular menstruation, unspecified: Secondary | ICD-10-CM | POA: Diagnosis not present

## 2018-05-01 MED ORDER — NORETHIN ACE-ETH ESTRAD-FE 1-20 MG-MCG(24) PO CAPS: 1.0000 | ORAL_CAPSULE | Freq: Every day | ORAL | 3 refills | Status: DC

## 2018-05-01 NOTE — Patient Instructions (Signed)
Ethinyl Estradiol; Norethindrone Acetate; Ferrous fumarate tablets or capsules What is this medicine? ETHINYL ESTRADIOL; NORETHINDRONE ACETATE; FERROUS FUMARATE (ETH in il es tra DYE ole; nor eth IN drone AS e tate; FER us FUE ma rate) is an oral contraceptive. The products combine two types of female hormones, an estrogen and a progestin. They are used to prevent ovulation and pregnancy. Some products are also used to treat acne in females. This medicine may be used for other purposes; ask your health care provider or pharmacist if you have questions. COMMON BRAND NAME(S): Blisovi 24 Fe, Blisovi Fe, Estrostep Fe, Gildess 24 Fe, Gildess Fe 1.5/30, Gildess Fe 1/20, Junel Fe 1.5/30, Junel Fe 1/20, Junel Fe 24, Larin Fe, Lo Loestrin Fe, Loestrin 24 Fe, Loestrin FE 1.5/30, Loestrin FE 1/20, Lomedia 24 Fe, Microgestin 24 Fe, Microgestin Fe 1.5/30, Microgestin Fe 1/20, Tarina Fe 1/20, Taytulla, Tilia Fe, Tri-Legest Fe What should I tell my health care provider before I take this medicine? They need to know if you have any of these conditions: -abnormal vaginal bleeding -blood vessel disease -breast, cervical, endometrial, ovarian, liver, or uterine cancer -diabetes -gallbladder disease -heart disease or recent heart attack -high blood pressure -high cholesterol -history of blood clots -kidney disease -liver disease -migraine headaches -smoke tobacco -stroke -systemic lupus erythematosus (SLE) -an unusual or allergic reaction to estrogens, progestins, other medicines, foods, dyes, or preservatives -pregnant or trying to get pregnant -breast-feeding How should I use this medicine? Take this medicine by mouth. To reduce nausea, this medicine may be taken with food. Follow the directions on the prescription label. Take this medicine at the same time each day and in the order directed on the package. Do not take your medicine more often than directed. A patient package insert for the product will be  given with each prescription and refill. Read this sheet carefully each time. The sheet may change frequently. Contact your pediatrician regarding the use of this medicine in children. Special care may be needed. This medicine has been used in female children who have started having menstrual periods. Overdosage: If you think you have taken too much of this medicine contact a poison control center or emergency room at once. NOTE: This medicine is only for you. Do not share this medicine with others. What if I miss a dose? If you miss a dose, refer to the patient information sheet you received with your medicine for direction. If you miss more than one pill, this medicine may not be as effective and you may need to use another form of birth control. What may interact with this medicine? Do not take this medicine with the following medication: -dasabuvir; ombitasvir; paritaprevir; ritonavir -ombitasvir; paritaprevir; ritonavir This medicine may also interact with the following medications: -acetaminophen -antibiotics or medicines for infections, especially rifampin, rifabutin, rifapentine, and griseofulvin, and possibly penicillins or tetracyclines -aprepitant -ascorbic acid (vitamin C) -atorvastatin -barbiturate medicines, such as phenobarbital -bosentan -carbamazepine -caffeine -clofibrate -cyclosporine -dantrolene -doxercalciferol -felbamate -grapefruit juice -hydrocortisone -medicines for anxiety or sleeping problems, such as diazepam or temazepam -medicines for diabetes, including pioglitazone -mineral oil -modafinil -mycophenolate -nefazodone -oxcarbazepine -phenytoin -prednisolone -ritonavir or other medicines for HIV infection or AIDS -rosuvastatin -selegiline -soy isoflavones supplements -St. John's wort -tamoxifen or raloxifene -theophylline -thyroid hormones -topiramate -warfarin This list may not describe all possible interactions. Give your health care  provider a list of all the medicines, herbs, non-prescription drugs, or dietary supplements you use. Also tell them if you smoke, drink alcohol, or use illegal drugs. Some   items may interact with your medicine. What should I watch for while using this medicine? Visit your doctor or health care professional for regular checks on your progress. You will need a regular breast and pelvic exam and Pap smear while on this medicine. Use an additional method of contraception during the first cycle that you take these tablets. If you have any reason to think you are pregnant, stop taking this medicine right away and contact your doctor or health care professional. If you are taking this medicine for hormone related problems, it may take several cycles of use to see improvement in your condition. Smoking increases the risk of getting a blood clot or having a stroke while you are taking birth control pills, especially if you are more than 33 years old. You are strongly advised not to smoke. This medicine can make your body retain fluid, making your fingers, hands, or ankles swell. Your blood pressure can go up. Contact your doctor or health care professional if you feel you are retaining fluid. This medicine can make you more sensitive to the sun. Keep out of the sun. If you cannot avoid being in the sun, wear protective clothing and use sunscreen. Do not use sun lamps or tanning beds/booths. If you wear contact lenses and notice visual changes, or if the lenses begin to feel uncomfortable, consult your eye care specialist. In some women, tenderness, swelling, or minor bleeding of the gums may occur. Notify your dentist if this happens. Brushing and flossing your teeth regularly may help limit this. See your dentist regularly and inform your dentist of the medicines you are taking. If you are going to have elective surgery, you may need to stop taking this medicine before the surgery. Consult your health care  professional for advice. This medicine does not protect you against HIV infection (AIDS) or any other sexually transmitted diseases. What side effects may I notice from receiving this medicine? Side effects that you should report to your doctor or health care professional as soon as possible: -allergic reactions like skin rash, itching or hives, swelling of the face, lips, or tongue -breast tissue changes or discharge -changes in vaginal bleeding during your period or between your periods -changes in vision -chest pain -confusion -coughing up blood -dizziness -feeling faint or lightheaded -headaches or migraines -leg, arm or groin pain -loss of balance or coordination -severe or sudden headaches -stomach pain (severe) -sudden shortness of breath -sudden numbness or weakness of the face, arm or leg -symptoms of vaginal infection like itching, irritation or unusual discharge -tenderness in the upper abdomen -trouble speaking or understanding -vomiting -yellowing of the eyes or skin Side effects that usually do not require medical attention (report to your doctor or health care professional if they continue or are bothersome): -breakthrough bleeding and spotting that continues beyond the 3 initial cycles of pills -breast tenderness -mood changes, anxiety, depression, frustration, anger, or emotional outbursts -increased sensitivity to sun or ultraviolet light -nausea -skin rash, acne, or brown spots on the skin -weight gain (slight) This list may not describe all possible side effects. Call your doctor for medical advice about side effects. You may report side effects to FDA at 1-800-FDA-1088. Where should I keep my medicine? Keep out of the reach of children. Store at room temperature between 15 and 30 degrees C (59 and 86 degrees F). Throw away any unused medicine after the expiration date. NOTE: This sheet is a summary. It may not cover all possible information. If you   have  questions about this medicine, talk to your doctor, pharmacist, or health care provider.  2018 Elsevier/Gold Standard (2016-04-17 08:04:41)  

## 2018-05-01 NOTE — Progress Notes (Signed)
HPI:      Ms. Brooke Mclaughlin is a 32 y.o. G1P1001 who LMP was Patient's last menstrual period was 03/23/2018., she presents today for her annual examination. The patient has no complaints today. The patient is sexually active. Her last pap: approximate date 2017 and was normal. The patient does perform self breast exams.  There is no notable family history of breast or ovarian cancer in her family.  The patient has regular exercise: yes.  The patient denies current symptoms of depression.    GYN History: Contraception: OCP (estrogen/progesterone)     Pt has periods that begin at irreg intervals despite pill use  PMHx: Past Medical History:  Diagnosis Date  . Eczema   . Frequent headaches   . History of Papanicolaou smear of cervix 12/31/13; 05/01/16   NEG; -/-  . Irregular menses   . Migraines   . Palpitations   . Syncope 2003   has felt that way since but did not faint  . Urticaria   . UTI (lower urinary tract infection)    Past Surgical History:  Procedure Laterality Date  . ADENOIDECTOMY    . TONSILECTOMY, ADENOIDECTOMY, BILATERAL MYRINGOTOMY AND TUBES Bilateral   . TYMPANOSTOMY TUBE PLACEMENT     Family History  Problem Relation Age of Onset  . Hypertension Mother        RUNS ON MAT SIDE OF FAMILY  . Migraines Father   . Hypertension Maternal Grandmother   . Heart disease Maternal Grandmother   . Heart attack Maternal Grandmother 28  . Heart disease Maternal Aunt   . Heart attack Maternal Aunt   . Hypertension Maternal Aunt   . Heart disease Maternal Uncle   . Heart attack Maternal Uncle   . Diabetes Maternal Uncle   . Hypertension Maternal Uncle    Social History   Tobacco Use  . Smoking status: Never Smoker  . Smokeless tobacco: Never Used  Substance Use Topics  . Alcohol use: Yes    Alcohol/week: 6.0 standard drinks    Types: 6 Cans of beer per week  . Drug use: No    Current Outpatient Medications:  .  Fluticasone Propionate (XHANCE) 93 MCG/ACT EXHU,  Place 2 sprays into the nose 2 (two) times daily., Disp: 32 mL, Rfl: 12 .  loratadine (CLARITIN) 10 MG tablet, Take 10 mg by mouth daily., Disp: , Rfl:  .  Multiple Vitamin (MULTIVITAMIN) tablet, Take 1 tablet by mouth daily., Disp: , Rfl:  .  Vitamin D, Ergocalciferol, (DRISDOL) 50000 units CAPS capsule, Take 1 capsule (50,000 Units total) by mouth every 7 (seven) days., Disp: 16 capsule, Rfl: 0 .  Olopatadine HCl (PAZEO) 0.7 % SOLN, Place 1 drop into both eyes daily. (Patient not taking: Reported on 05/01/2018), Disp: 1 Bottle, Rfl: 5 .  triamcinolone ointment (KENALOG) 0.1 %, Apply 1 application topically 2 (two) times daily., Disp: 80 g, Rfl: 3 No current facility-administered medications for this visit.   Facility-Administered Medications Ordered in Other Visits:  .  gadopentetate dimeglumine (MAGNEVIST) injection 19 mL, 19 mL, Intravenous, Once PRN, Anson Fret, MD Allergies: Patient has no known allergies.  Review of Systems  Constitutional: Negative for chills, fever and malaise/fatigue.  HENT: Negative for congestion, sinus pain and sore throat.   Eyes: Negative for blurred vision and pain.  Respiratory: Negative for cough and wheezing.   Cardiovascular: Negative for chest pain and leg swelling.  Gastrointestinal: Negative for abdominal pain, constipation, diarrhea, heartburn, nausea and vomiting.  Genitourinary: Negative for dysuria, frequency, hematuria and urgency.  Musculoskeletal: Negative for back pain, joint pain, myalgias and neck pain.  Skin: Negative for itching and rash.  Neurological: Negative for dizziness, tremors and weakness.  Endo/Heme/Allergies: Does not bruise/bleed easily.  Psychiatric/Behavioral: Negative for depression. The patient is not nervous/anxious and does not have insomnia.     Objective: BP 130/80   Ht 5\' 4"  (1.626 m)   Wt 207 lb (93.9 kg)   LMP 03/23/2018   BMI 35.53 kg/m   Filed Weights   05/01/18 0949  Weight: 207 lb (93.9 kg)   Body  mass index is 35.53 kg/m. Physical Exam  Constitutional: She is oriented to person, place, and time. She appears well-developed and well-nourished. No distress.  Genitourinary: Rectum normal, vagina normal and uterus normal. Pelvic exam was performed with patient supine. There is no rash or lesion on the right labia. There is no rash or lesion on the left labia. Vagina exhibits no lesion. No bleeding in the vagina. Right adnexum does not display mass and does not display tenderness. Left adnexum does not display mass and does not display tenderness. Cervix does not exhibit motion tenderness, lesion, friability or polyp.   Uterus is mobile and midaxial. Uterus is not enlarged or exhibiting a mass.  HENT:  Head: Normocephalic and atraumatic. Head is without laceration.  Right Ear: Hearing normal.  Left Ear: Hearing normal.  Nose: No epistaxis.  No foreign bodies.  Mouth/Throat: Uvula is midline, oropharynx is clear and moist and mucous membranes are normal.  Eyes: Pupils are equal, round, and reactive to light.  Neck: Normal range of motion. Neck supple. No thyromegaly present.  Cardiovascular: Normal rate and regular rhythm. Exam reveals no gallop and no friction rub.  No murmur heard. Pulmonary/Chest: Effort normal and breath sounds normal. No respiratory distress. She has no wheezes. Right breast exhibits no mass, no skin change and no tenderness. Left breast exhibits no mass, no skin change and no tenderness.  Abdominal: Soft. Bowel sounds are normal. She exhibits no distension. There is no tenderness. There is no rebound.  Musculoskeletal: Normal range of motion.  Neurological: She is alert and oriented to person, place, and time. No cranial nerve deficit.  Skin: Skin is warm and dry.  Psychiatric: She has a normal mood and affect. Judgment normal.  Vitals reviewed.  Assessment:  ANNUAL EXAM 1. Annual physical exam   2. Irregular menses    Screening Plan:            1.  Cervical  Screening-  Pap smear schedule reviewed with patient  2. Labs managed by PCP  3. Counseling for contraception: oral contraceptives (estrogen/progesterone)   4. Irregular menses Will change OCP today and see how responds Plans pregnancy soon PNV advised    F/U  Return in about 1 year (around 05/02/2019) for Annual.  Annamarie Major, MD, [facility], [facility] 05/01/2018  10:11 AM

## 2018-08-21 NOTE — L&D Delivery Note (Signed)
Patient is a 33 y.o. now G2P2 s/p NSVD at [redacted]w[redacted]d, who was admitted for SOL.  She progressed with augmentation (AROM, Pitocin) to complete and pushed 15 minutes to deliver.  Cord clamping delayed by several minutes then clamped by CNM and cut by FOB.  Placenta intact and spontaneous, bleeding minimal.  2nd degree laceration repaired without difficulty.  Mom and baby stable prior to transfer to postpartum. She plans on breastfeeding. She is unsure of method for birth control.  Delivery Note At 3:57 PM a viable and healthy female was delivered via Vaginal, Spontaneous (Presentation: LOA).  APGAR: 9, 9; weight pending .    Placenta intact and spontaneous, bleeding minimal. 3V Cord with no complications.  Anesthesia: Epidural    Episiotomy: None Lacerations: 2nd degree Suture Repair: 3.0 vicryl Est. Blood Loss (mL): 199  Mom to postpartum.  Baby to Couplet care / Skin to Skin.  Lajean Manes CNM 04/21/2019, 4:37 PM

## 2018-08-25 ENCOUNTER — Encounter: Payer: Self-pay | Admitting: Adult Health

## 2018-09-09 ENCOUNTER — Ambulatory Visit (INDEPENDENT_AMBULATORY_CARE_PROVIDER_SITE_OTHER): Payer: BLUE CROSS/BLUE SHIELD | Admitting: Advanced Practice Midwife

## 2018-09-09 ENCOUNTER — Other Ambulatory Visit (HOSPITAL_COMMUNITY)
Admission: RE | Admit: 2018-09-09 | Discharge: 2018-09-09 | Disposition: A | Discharge status: Home / Self Care | Payer: BLUE CROSS/BLUE SHIELD | Source: Ambulatory Visit | Attending: Advanced Practice Midwife | Admitting: Advanced Practice Midwife

## 2018-09-09 ENCOUNTER — Encounter: Payer: Self-pay | Admitting: Advanced Practice Midwife

## 2018-09-09 VITALS — BP 122/76 | Wt 215.0 lb

## 2018-09-09 DIAGNOSIS — Z113 Encounter for screening for infections with a predominantly sexual mode of transmission: Secondary | ICD-10-CM | POA: Diagnosis not present

## 2018-09-09 DIAGNOSIS — Z3201 Encounter for pregnancy test, result positive: Secondary | ICD-10-CM | POA: Diagnosis not present

## 2018-09-09 DIAGNOSIS — O99210 Obesity complicating pregnancy, unspecified trimester: Secondary | ICD-10-CM

## 2018-09-09 DIAGNOSIS — Z3480 Encounter for supervision of other normal pregnancy, unspecified trimester: Secondary | ICD-10-CM | POA: Diagnosis not present

## 2018-09-09 DIAGNOSIS — B001 Herpesviral vesicular dermatitis: Secondary | ICD-10-CM

## 2018-09-09 DIAGNOSIS — N912 Amenorrhea, unspecified: Secondary | ICD-10-CM

## 2018-09-09 DIAGNOSIS — Z3A01 Less than 8 weeks gestation of pregnancy: Secondary | ICD-10-CM

## 2018-09-09 DIAGNOSIS — O9989 Other specified diseases and conditions complicating pregnancy, childbirth and the puerperium: Secondary | ICD-10-CM

## 2018-09-09 DIAGNOSIS — O99211 Obesity complicating pregnancy, first trimester: Secondary | ICD-10-CM

## 2018-09-09 LAB — POCT URINE PREGNANCY: Preg Test, Ur: POSITIVE — AB

## 2018-09-09 MED ORDER — VALACYCLOVIR HCL 500 MG PO TABS: 500.0000 mg | ORAL_TABLET | Freq: Two times a day (BID) | ORAL | 5 refills | Status: DC

## 2018-09-09 NOTE — Addendum Note (Signed)
Addended by: [place], Hurman Ketelsen B on: 09/09/2018 03:49 PM   Modules accepted: Orders

## 2018-09-09 NOTE — Patient Instructions (Signed)
Exercise During Pregnancy For people of all ages, exercise is an important part of being healthy. Exercise improves heart and lung function and helps to maintain strength, flexibility, and a healthy body weight. Exercise also boosts energy levels and elevates mood. For most women, maintaining an exercise routine throughout pregnancy is recommended. It is only on rare occasions and with certain medical conditions or pregnancy complications that women may be asked to limit or avoid exercise during pregnancy. What are some other benefits to exercising during pregnancy? Along with maintaining strength and flexibility, exercising throughout pregnancy can help to:  Keep strength in muscles that are very important during labor and childbirth.  Decrease low back pain during pregnancy.  Decrease the risk of developing gestational diabetes mellitus (GDM).  Improve blood sugar (glucose) control for women who have GDM.  Decrease the risk of developing preeclampsia. This is a serious condition that causes high blood pressure along with other symptoms, such as swelling and headaches.  Decrease the risk of cesarean delivery.  Speed up the recovery after giving birth. How often should I exercise? Unless your health care provider gives you different instructions, you should try to exercise on most days or all days of the week. In general, try to exercise with moderate intensity for about 150 minutes per week. This can be spread out across several days, such as exercising for 30 minutes per day on 5 days of each week. You can tell that you are exercising at a moderate intensity if you have a higher heart rate and faster breathing, but you are still able to hold a conversation. What types of moderate-intensity exercise are recommended during pregnancy? There are many types of exercise that are safe for you to do during pregnancy. Unless your health care provider gives you different instructions, do a variety of  exercises that safely increase your heart and breathing (cardiopulmonary) rates and help you to build and maintain muscle strength (strength training). You should always be able to talk in full sentences while exercising during pregnancy. Some examples of exercising that is safe to do during pregnancy include:  Brisk walking or hiking.  Swimming.  Water aerobics.  Riding a stationary bike.  Strength training.  Modified yoga or Pilates. Tell your instructor that you are pregnant. Avoid overstretching and avoid lying on your back for long periods of time.  Running or jogging. Only choose this type of exercise if: ? You ran or jogged regularly before your pregnancy. ? You can run or jog and still talk in complete sentences. What types of exercise should I not do during pregnancy? Depending on your level of fitness and whether you exercised regularly before your pregnancy, you may be advised to limit vigorous-intensity exercise during your pregnancy. You can tell that you are exercising at a vigorous intensity if you are breathing much harder and faster and cannot hold a conversation while exercising. Some examples of exercising that you should avoid during pregnancy include:  Contact sports.  Activities that place you at risk for falling on or being hit in the belly, such as downhill skiing, water skiing, surfing, rock climbing, cycling, gymnastics, and horseback riding.  Scuba diving.  Sky diving.  Yoga or Pilates in a room that is heated to extreme temperatures ("hot yoga" or "hot Pilates").  Jogging or running, unless you ran or jogged regularly before your pregnancy. While jogging or running, you should always be able to talk in full sentences. Do not run or jog so vigorously that you   are unable to have a conversation.  If you are not used to exercising at elevation (more than 6,000 feet above sea level), do not do so during your pregnancy. When should I avoid exercising during  pregnancy? Certain medical conditions can make it unsafe to exercise during pregnancy, or they may increase your risk of miscarriage or early labor and birth. Some of these conditions include:  Some types of heart disease.  Some types of lung disease.  Placenta previa. This is when the placenta partially or completely covers the opening of the uterus (cervix).  Frequent bleeding from the vagina during your pregnancy.  Incompetent cervix. This is when your cervix does not remain as tightly closed during pregnancy as it should.  Premature labor.  Ruptured membranes. This is when the protective sac (amniotic sac) opens up and amniotic fluid leaks from your vagina.  Severely low blood count (anemia).  Preeclampsia or pregnancy-caused high blood pressure.  Carrying more than one baby (multiple gestation) and having an additional risk of early labor.  Poorly controlled diabetes.  Being severely underweight or severely overweight.  Intrauterine growth restriction. This is when your baby's growth and development during pregnancy are slower than expected.  Other medical conditions. Ask your health care provider if any apply to you. What else should I know about exercising during pregnancy? You should take these precautions while exercising during pregnancy:  Avoid overheating. ? Wear loose-fitting, breathable clothes. ? Do not exercise in very high temperatures.  Avoid dehydration. Drink enough water before, during, and after exercise to keep your urine clear or pale yellow.  Avoid overstretching. Because of hormone changes during pregnancy, it is easy to overstretch muscles, tendons, and ligaments during pregnancy.  Start slowly and ask your health care provider to recommend types of exercise that are safe for you, if exercising regularly is new for you. Pregnancy is not a time for exercising to lose weight. When should I seek medical care? You should stop exercising and call your  health care provider if you have any unusual symptoms, such as:  Mild uterine contractions or abdominal cramping.  Dizziness that does not improve with rest. When should I seek immediate medical care? You should stop exercising and call your local emergency services (911 in the [place] if you have any unusual symptoms, such as:  Sudden, severe pain in your low back or your belly.  Uterine contractions or abdominal cramping that do not improve with rest.  Chest pain.  Bleeding or fluid leaking from your vagina.  Shortness of breath. This information is not intended to replace advice given to you by your health care provider. Make sure you discuss any questions you have with your health care provider. Document Released: 08/07/2005 Document Revised: 01/05/2016 Document Reviewed: 10/15/2014 Elsevier Interactive Patient Education  2019 [employer] Eating Plan for Pregnant Women While you are pregnant, your body requires additional nutrition to help support your growing baby. You also have a higher need for some vitamins and minerals, such as folic acid, calcium, iron, and vitamin D. Eating a healthy, well-balanced diet is very important for your health and your baby's health. Your need for extra calories varies for the three 3-month segments of your pregnancy (trimesters). For most women, it is recommended to consume:  150 extra calories a day during the first trimester.  300 extra calories a day during the second trimester.  300 extra calories a day during the third trimester. What are tips for following this plan?   Do   not try to lose weight or go on a diet during pregnancy.  Limit your overall intake of foods that have "empty calories." These are foods that have little nutritional value, such as sweets, desserts, candies, and sugar-sweetened beverages.  Eat a variety of foods (especially fruits and vegetables) to get a full range of vitamins and minerals.  Take a prenatal vitamin  to help meet your additional vitamin and mineral needs during pregnancy, specifically for folic acid, iron, calcium, and vitamin D.  Remember to stay active. Ask your health care provider what types of exercise and activities are safe for you.  Practice good food safety and cleanliness. Wash your hands before you eat and after you prepare raw meat. Wash all fruits and vegetables well before peeling or eating. Taking these actions can help to prevent food-borne illnesses that can be very dangerous to your baby, such as listeriosis. Ask your health care provider for more information about listeriosis. What does 150 extra calories look like? Healthy options that provide 150 extra calories each day could be any of the following:  6-8 oz (170-230 g) of plain low-fat yogurt with  cup of berries.  1 apple with 2 teaspoons (11 g) of peanut butter.  Cut-up vegetables with  cup (60 g) of hummus.  8 oz (230 mL) or 1 cup of low-fat chocolate milk.  1 stick of string cheese with 1 medium orange.  1 peanut butter and jelly sandwich that is made with one slice of whole-wheat bread and 1 tsp (5 g) of peanut butter. For 300 extra calories, you could eat two of those healthy options each day. What is a healthy amount of weight to gain? The right amount of weight gain for you is based on your BMI before you became pregnant. If your BMI:  Was less than 18 (underweight), you should gain 28-40 lb (13-18 kg).  Was 18-24.9 (normal), you should gain 25-35 lb (11-16 kg).  Was 25-29.9 (overweight), you should gain 15-25 lb (7-11 kg).  Was 30 or greater (obese), you should gain 11-20 lb (5-9 kg). What if I am having twins or multiples? Generally, if you are carrying twins or multiples:  You may need to eat 300-600 extra calories a day.  The recommended range for total weight gain is 25-54 lb (11-25 kg), depending on your BMI before pregnancy.  Talk with your health care provider to find out about  nutritional needs, weight gain, and exercise that is right for you. What foods can I eat?  Grains All grains. Choose whole grains, such as whole-wheat bread, oatmeal, or brown rice. Vegetables All vegetables. Eat a variety of colors and types of vegetables. Remember to wash your vegetables well before peeling or eating. Fruits All fruits. Eat a variety of colors and types of fruit. Remember to wash your fruits well before peeling or eating. Meats and other protein foods Lean meats, including chicken, turkey, fish, and lean cuts of beef, veal, or pork. If you eat fish or seafood, choose options that are higher in omega-3 fatty acids and lower in mercury, such as salmon, herring, mussels, trout, sardines, pollock, shrimp, crab, and lobster. Tofu. Tempeh. Beans. Eggs. Peanut butter and other nut butters. Make sure that all meats, poultry, and eggs are cooked to food-safe temperatures or "well-done." Two or more servings of fish are recommended each week in order to get the most benefits from omega-3 fatty acids that are found in seafood. Choose fish that are lower in mercury. You can   find more information online:  www.fda.gov Dairy Pasteurized milk and milk alternatives (such as almond milk). Pasteurized yogurt and pasteurized cheese. Cottage cheese. Sour cream. Beverages Water. Juices that contain 100% fruit juice or vegetable juice. Caffeine-free teas and decaffeinated coffee. Drinks that contain caffeine are okay to drink, but it is better to avoid caffeine. Keep your total caffeine intake to less than 200 mg each day (which is 12 oz or 355 mL of coffee, tea, or soda) or the limit as told by your health care provider. Fats and oils Fats and oils are okay to include in moderation. Sweets and desserts Sweets and desserts are okay to include in moderation. Seasoning and other foods All pasteurized condiments. The items listed above may not be a complete list of recommended foods and beverages.  Contact your dietitian for more options. What foods are not recommended? Vegetables Raw (unpasteurized) vegetable juices. Fruits Unpasteurized fruit juices. Meats and other protein foods Lunch meats, bologna, hot dogs, or other deli meats. (If you must eat those meats, reheat them until they are steaming hot.) Refrigerated pat, meat spreads from a meat counter, smoked seafood that is found in the refrigerated section of a store. Raw or undercooked meats, poultry, and eggs. Raw fish, such as sushi or sashimi. Fish that have high mercury content, such as tilefish, shark, swordfish, and king mackerel. To learn more about mercury in fish, talk with your health care provider or look for online resources, such as:  www.fda.gov Dairy Raw (unpasteurized) milk and any foods that have raw milk in them. Soft cheeses, such as feta, queso blanco, queso fresco, Brie, Camembert cheeses, blue-veined cheeses, and Panela cheese (unless it is made with pasteurized milk, which must be stated on the label). Beverages Alcohol. Sugar-sweetened beverages, such as sodas, teas, or energy drinks. Seasoning and other foods Homemade fermented foods and drinks, such as pickles, sauerkraut, or kombucha drinks. (Store-bought pasteurized versions of these are okay.) Salads that are made in a store or deli, such as ham salad, chicken salad, egg salad, tuna salad, and seafood salad. The items listed above may not be a complete list of foods and beverages to avoid. Contact your dietitian for more information. Where to find more information To calculate the number of calories you need based on your height, weight, and activity level, you can use an online calculator such as:  www.choosemyplate.gov/MyPlatePlan To calculate how much weight you should gain during pregnancy, you can use an online pregnancy weight gain calculator such as:  www.choosemyplate.gov/pregnancy-weight-gain-calculator Summary  While you are pregnant,  your body requires additional nutrition to help support your growing baby.  Eat a variety of foods, especially fruits and vegetables to get a full range of vitamins and minerals.  Practice good food safety and cleanliness. Wash your hands before you eat and after you prepare raw meat. Wash all fruits and vegetables well before peeling or eating. Taking these actions can help to prevent food-borne illnesses, such as listeriosis, that can be very dangerous to your baby.  Do not eat raw meat or fish. Do not eat fish that have high mercury content, such as tilefish, shark, swordfish, and king mackerel. Do not eat unpasteurized (raw) dairy.  Take a prenatal vitamin to help meet your additional vitamin and mineral needs during pregnancy, specifically for folic acid, iron, calcium, and vitamin D. This information is not intended to replace advice given to you by your health care provider. Make sure you discuss any questions you have with your health care   provider. Document Released: 05/22/2014 Document Revised: 05/04/2017 Document Reviewed: 05/04/2017 Elsevier Interactive Patient Education  2019 [employer] Prenatal Care Prenatal care is health care during pregnancy. It helps you and your unborn baby (fetus) stay as healthy as possible. Prenatal care may be provided by a midwife, a family practice health care provider, or a childbirth and pregnancy specialist (obstetrician). How does this affect me? During pregnancy, you will be closely monitored for any new conditions that might develop. To lower your risk of pregnancy complications, you and your health care provider will talk about any underlying conditions you have. How does this affect my baby? Early and consistent prenatal care increases the chance that your baby will be healthy during pregnancy. Prenatal care lowers the risk that your baby will be:  Born early (prematurely).  Smaller than expected at birth (small for gestational age). What  can I expect at the first prenatal care visit? Your first prenatal care visit will likely be the longest. You should schedule your first prenatal care visit as soon as you know that you are pregnant. Your first visit is a good time to talk about any questions or concerns you have about pregnancy. At your visit, you and your health care provider will talk about:  Your medical history, including: ? Any past pregnancies. ? Your family's medical history. ? The baby's father's medical history. ? Any long-term (chronic) health conditions you have and how you manage them. ? Any surgeries or procedures you have had. ? Any current over-the-counter or prescription medicines, herbs, or supplements you are taking.  Other factors that could pose a risk to your baby, including:  Your home setting and your stress levels, including: ? Exposure to abuse or violence. ? Household financial strain. ? Mental health conditions you have.  Your daily health habits, including diet and exercise. Your health care provider will also:  Measure your weight, height, and blood pressure.  Do a physical exam, including a pelvic and breast exam.  Perform blood tests and urine tests to check for: ? Urinary tract infection. ? Sexually transmitted infections (STIs). ? Low iron levels in your blood (anemia). ? Blood type and certain proteins on red blood cells (Rh antibodies). ? Infections and immunity to viruses, such as hepatitis B and rubella. ? HIV (human immunodeficiency virus).  Do an ultrasound to confirm your baby's growth and development and to help predict your estimated due date (EDD). This ultrasound is done with a probe that is inserted into the vagina (transvaginal ultrasound).  Discuss your options for genetic screening.  Give you information about how to keep yourself and your baby healthy, including: ? Nutrition and taking vitamins. ? Physical activity. ? How to manage pregnancy symptoms such as  nausea and vomiting (morning sickness). ? Infections and substances that may be harmful to your baby and how to avoid them. ? Food safety. ? Dental care. ? Working. ? Travel. ? Warning signs to watch for and when to call your health care provider. How often will I have prenatal care visits? After your first prenatal care visit, you will have regular visits throughout your pregnancy. The visit schedule is often as follows:  Up to week 28 of pregnancy: once every 4 weeks.  28-36 weeks: once every 2 weeks.  After 36 weeks: every week until delivery. Some women may have visits more or less often depending on any underlying health conditions and the health of the baby. Keep all follow-up and prenatal care visits as told by   your health care provider. This is important. What happens during routine prenatal care visits? Your health care provider will:  Measure your weight and blood pressure.  Check for fetal heart sounds.  Measure the height of your uterus in your abdomen (fundal height). This may be measured starting around week 20 of pregnancy.  Check the position of your baby inside your uterus.  Ask questions about your diet, sleeping patterns, and whether you can feel the baby move.  Review warning signs to watch for and signs of labor.  Ask about any pregnancy symptoms you are having and how you are dealing with them. Symptoms may include: ? Headaches. ? Nausea and vomiting. ? Vaginal discharge. ? Swelling. ? Fatigue. ? Constipation. ? Any discomfort, including back or pelvic pain. Make a list of questions to ask your health care provider at your routine visits. What tests might I have during prenatal care visits? You may have blood, urine, and imaging tests throughout your pregnancy, such as:  Urine tests to check for glucose, protein, or signs of infection.  Glucose tests to check for a form of diabetes that can develop during pregnancy (gestational diabetes mellitus).  This is usually done around week 24 of pregnancy.  An ultrasound to check your baby's growth and development and to check for birth defects. This is usually done around week 20 of pregnancy.  A test to check for group B strep (GBS) infection. This is usually done around week 36 of pregnancy.  Genetic testing. This may include blood or imaging tests, such as an ultrasound. Some genetic tests are done during the first trimester and some are done during the second trimester. What else can I expect during prenatal care visits? Your health care provider may recommend getting certain vaccines during pregnancy. These may include:  A yearly flu shot (annual influenza vaccine). This is especially important if you will be pregnant during flu season.  Tdap (tetanus, diphtheria, pertussis) vaccine. Getting this vaccine during pregnancy can protect your baby from whooping cough (pertussis) after birth. This vaccine may be recommended between weeks 27 and 36 of pregnancy. Later in your pregnancy, your health care provider may give you information about:  Childbirth and breastfeeding classes.  Choosing a health care provider for your baby.  Umbilical cord banking.  Breastfeeding.  Birth control after your baby is born.  The hospital labor and delivery unit and how to tour it.  Registering at the hospital before you go into labor. Where to find more information  Office on Women's Health: womenshealth.gov  American Pregnancy Association: americanpregnancy.org  March of Dimes: marchofdimes.org Summary  Prenatal care helps you and your baby stay as healthy as possible during pregnancy.  Your first prenatal care visit will most likely be the longest.  You will have visits and tests throughout your pregnancy to monitor your health and your baby's health.  Bring a list of questions to your visits to ask your health care provider.  Make sure to keep all follow-up and prenatal care visits with  your health care provider. This information is not intended to replace advice given to you by your health care provider. Make sure you discuss any questions you have with your health care provider. Document Released: 08/10/2003 Document Revised: 08/06/2017 Document Reviewed: 08/06/2017 Elsevier Interactive Patient Education  2019 [employer]  

## 2018-09-09 NOTE — Progress Notes (Signed)
New Obstetric Patient H&P    Chief Complaint: "Desires prenatal care"   History of Present Illness: Patient is a 33 y.o. G2P1001 Not Hispanic or Latino female, presents with amenorrhea and positive home pregnancy test. Patient's last menstrual period was 07/18/2018 (exact date). and based on her  LMP, her EDD is Estimated Date of Delivery: 04/24/19 and her EGA is [redacted]w[redacted]d. Cycles are 5. days, regular, and occur approximately every : 28 days. Her last pap smear was 2 or 3 years ago and was no abnormalities.    She had a urine pregnancy test which was positive 3 or 4 week(s)  ago. Her last menstrual period was normal and lasted for  4 or 5 day(s). Since her LMP she claims she has experienced breast tenderness, fatigue, nausea. She denies vaginal bleeding. Her past medical history is noncontributory. Her prior pregnancies are notable for FT SVD 2012 7 pounds  Since her LMP, she admits to the use of tobacco products  no She claims she has gained   5 pounds since the start of her pregnancy.  There are cats in the home in the home  Yes primarily outdoor- no litterbox She admits close contact with children on a regular basis  yes  She has had chicken pox in the past yes She has had Tuberculosis exposures, symptoms, or previously tested positive for TB   no Current or past history of domestic violence. no  Genetic Screening/Teratology Counseling: (Includes patient, baby's father, or anyone in either family with:)   1. Patient's age >/= 58 at [facility]  no 2. Thalassemia ([place], [place], Mediterranean, or Asian background): MCV<80  no 3. Neural tube defect (meningomyelocele, spina bifida, anencephaly)  no 4. Congenital heart defect  no  5. Down syndrome  no 6. Tay-Sachs (Jewish, [place]  no 7. Canavan's Disease  no 8. Sickle cell disease or trait (African)  no  9. Hemophilia or other blood disorders  no  10. Muscular dystrophy  no  11. Cystic fibrosis  no  12. Huntington's Chorea  no  13.  Mental retardation/autism  no 14. Other inherited genetic or chromosomal disorder  no 15. Maternal metabolic disorder (DM, PKU, etc)  no 16. Patient or FOB with a child with a birth defect not listed above no  16a. Patient or FOB with a birth defect themselves no 17. Recurrent pregnancy loss, or stillbirth  no  18. Any medications since LMP other than prenatal vitamins (include vitamins, supplements, OTC meds, drugs, alcohol)  Claritin 19. Any other genetic/environmental exposure to discuss  no  Infection History:   1. Lives with someone with TB or TB exposed  no  2. Patient or partner has history of genital herpes  Patient has recurrent cold sores. She denies any genital outbreak. 3. Rash or viral illness since LMP  no 4. History of STI (GC, CT, HPV, syphilis, HIV)  no 5. History of recent travel :  no  Other pertinent information:  no     Review of Systems:10 point review of systems negative unless otherwise noted in HPI  Past Medical History:  Past Medical History:  Diagnosis Date  . Eczema   . Frequent headaches   . History of Papanicolaou smear of cervix 12/31/13; 05/01/16   NEG; -/-  . Irregular menses   . Migraines   . Palpitations   . Syncope 2003   has felt that way since but did not faint  . Urticaria   . UTI (lower urinary tract infection)  Past Surgical History:  Past Surgical History:  Procedure Laterality Date  . ADENOIDECTOMY    . TONSILECTOMY, ADENOIDECTOMY, BILATERAL MYRINGOTOMY AND TUBES Bilateral   . TYMPANOSTOMY TUBE PLACEMENT      Gynecologic History: Patient's last menstrual period was 07/18/2018 (exact date).  Obstetric History: G2P1001  Family History:  Family History  Problem Relation Age of Onset  . Hypertension Mother        RUNS ON MAT SIDE OF FAMILY  . Migraines Father   . Hypertension Maternal Grandmother   . Heart disease Maternal Grandmother   . Heart attack Maternal Grandmother 28  . Heart disease Maternal Aunt   . Heart  attack Maternal Aunt   . Hypertension Maternal Aunt   . Heart disease Maternal Uncle   . Heart attack Maternal Uncle   . Diabetes Maternal Uncle   . Hypertension Maternal Uncle    There is no family history of gyn cancers  Social History:  Social History   Socioeconomic History  . Marital status: Single    Spouse name: Not on file  . Number of children: 1  . Years of education: 71  . Highest education level: Not on file  Occupational History  . Occupation: ENGINEER    CommentPublic librarian  Social Needs  . Financial resource strain: Not on file  . Food insecurity:    Worry: Not on file    Inability: Not on file  . Transportation needs:    Medical: No    Non-medical: No  Tobacco Use  . Smoking status: Never Smoker  . Smokeless tobacco: Never Used  Substance and Sexual Activity  . Alcohol use: Yes    Alcohol/week: 6.0 standard drinks    Types: 6 Cans of beer per week  . Drug use: No  . Sexual activity: Yes    Birth control/protection: Pill  Lifestyle  . Physical activity:    Days per week: Not on file    Minutes per session: Not on file  . Stress: Not on file  Relationships  . Social connections:    Talks on phone: Not on file    Gets together: Not on file    Attends religious service: Not on file    Active member of club or organization: Not on file    Attends meetings of clubs or organizations: Not on file    Relationship status: Not on file  . Intimate partner violence:    Fear of current or ex partner: Not on file    Emotionally abused: Not on file    Physically abused: Not on file    Forced sexual activity: Not on file  Other Topics Concern  . Not on file  Social History Narrative   Lives at home with boyfriend, son, and boyfriend's son   Right handed   Drinks </= 1 cup caffeine daily    Allergies:  No Known Allergies  Medications: Prior to Admission medications   Medication Sig Start Date End Date Taking? Authorizing Provider  loratadine  (CLARITIN) 10 MG tablet Take 10 mg by mouth daily.   Yes [provider]  Prenatal Vit-Fe Fumarate-FA (MULTIVITAMIN-PRENATAL) 27-0.8 MG TABS tablet Take 1 tablet by mouth daily at 12 noon.   Yes [provider]  triamcinolone ointment (KENALOG) 0.1 % Apply 1 application topically 2 (two) times daily. 11/08/17  Yes Alfonse Spruce, MD  valACYclovir (VALTREX) 500 MG tablet Take 1 tablet (500 mg total) by mouth 2 (two) times daily for 3 days. 09/09/18  09/12/18  Tresea MallGledhill, Akili Cuda, CNM    Physical Exam Vitals: Blood pressure 122/76, weight 215 lb (97.5 kg), last menstrual period 07/18/2018.  General: NAD HEENT: normocephalic, anicteric Thyroid: no enlargement, no palpable nodules Pulmonary: No increased work of breathing, CTAB Cardiovascular: RRR, distal pulses 2+ Abdomen: NABS, soft, non-tender, non-distended.  Umbilicus without lesions.  No hepatomegaly, splenomegaly or masses palpable. No evidence of hernia  Genitourinary: Deferred for no concerns/PAP interval Extremities: no edema, erythema, or tenderness Neurologic: Grossly intact Psychiatric: mood appropriate, affect full   Assessment: 33 y.o. G2P1001 at 2271w4d presenting to initiate prenatal care  Plan: 1) Avoid alcoholic beverages. 2) Patient encouraged not to smoke.  3) Discontinue the use of all non-medicinal drugs and chemicals.  4) Take prenatal vitamins daily.  5) Nutrition, food safety (fish, cheese advisories, and high nitrite foods) and exercise discussed. 6) Hospital and practice style discussed with cross coverage system.  7) Genetic Screening, such as with 1st Trimester Screening, cell free fetal DNA, AFP testing, and Ultrasound, as well as with amniocentesis and CVS as appropriate, is discussed with patient. At the conclusion of today's visit patient is undecided about genetic testing 8) Patient is asked about travel to areas at risk for the Zika virus, and counseled to avoid travel and exposure to  mosquitoes or sexual partners who may have themselves been exposed to the virus. Testing is discussed, and will be ordered as appropriate.    Tresea MallJane Tamotsu Wiederholt, CNM [facility], [facility] 09/09/2018, 11:27 AM

## 2018-09-09 NOTE — Progress Notes (Signed)
NOB 

## 2018-09-10 LAB — CERVICOVAGINAL ANCILLARY ONLY
CHLAMYDIA, DNA PROBE: NEGATIVE
NEISSERIA GONORRHEA: NEGATIVE
Trichomonas: NEGATIVE

## 2018-09-11 LAB — URINE CULTURE

## 2018-09-18 ENCOUNTER — Ambulatory Visit: Payer: BLUE CROSS/BLUE SHIELD

## 2018-09-18 ENCOUNTER — Encounter: Payer: BLUE CROSS/BLUE SHIELD | Admitting: Obstetrics and Gynecology

## 2018-09-24 NOTE — Progress Notes (Signed)
Subjective:   Brooke Mclaughlin is a 33 y.o. G2P1001 at [redacted]w[redacted]d by LMP being seen today for her first obstetrical visit.  Her obstetrical history is significant for one term SVD. Had an intake visit at [facility], but wants to continue care here. Patient does intend to breast feed. Pregnancy history fully reviewed.  Patient reports nausea and breast tenderness. Does not want medication for this.  HISTORY: OB History  Gravida Para Term Preterm AB Living  2 1 1  0 0 1  SAB TAB Ectopic Multiple Live Births  0 0 0 0 1    # Outcome Date GA Lbr Len/2nd Weight Sex Delivery Anes PTL Lv  2 Current           1 Term 11/15/10 [redacted]w[redacted]d  8 lb 4 oz (3.742 kg) M Vag-Spont   LIV     Name: Flavia Shipper  Last pap smear was done 05/01/2016 and was normal  Past Medical History:  Diagnosis Date  . Eczema   . Frequent headaches   . History of cold sores    No genital lesions  . History of Papanicolaou smear of cervix 12/31/13; 05/01/16   NEG; -/-  . Irregular menses   . Migraines   . Palpitations   . Seasonal allergies 10/01/2017  . Syncope 2003   has felt that way since but did not faint  . Urticaria   . UTI (lower urinary tract infection)    Past Surgical History:  Procedure Laterality Date  . ADENOIDECTOMY    . TONSILECTOMY, ADENOIDECTOMY, BILATERAL MYRINGOTOMY AND TUBES Bilateral   . TYMPANOSTOMY TUBE PLACEMENT     Family History  Problem Relation Age of Onset  . Hypertension Mother        RUNS ON MAT SIDE OF FAMILY  . Migraines Father   . Hypertension Maternal Grandmother   . Heart disease Maternal Grandmother   . Heart attack Maternal Grandmother 28  . Heart disease Maternal Aunt   . Heart attack Maternal Aunt   . Hypertension Maternal Aunt   . Heart disease Maternal Uncle   . Heart attack Maternal Uncle   . Diabetes Maternal Uncle   . Hypertension Maternal Uncle    Social History   Tobacco Use  . Smoking status: Never Smoker  . Smokeless tobacco: Never Used  Substance Use  Topics  . Alcohol use: Yes    Alcohol/week: 6.0 standard drinks    Types: 6 Cans of beer per week  . Drug use: No   No Known Allergies Current Outpatient Medications on File Prior to Visit  Medication Sig Dispense Refill  . loratadine (CLARITIN) 10 MG tablet Take 10 mg by mouth daily.    . Prenatal Vit-Fe Fumarate-FA (MULTIVITAMIN-PRENATAL) 27-0.8 MG TABS tablet Take 1 tablet by mouth daily at 12 noon.    . triamcinolone ointment (KENALOG) 0.1 % Apply 1 application topically 2 (two) times daily. 80 g 3   Current Facility-Administered Medications on File Prior to Visit  Medication Dose Route Frequency Provider Last Rate Last Dose  . gadopentetate dimeglumine (MAGNEVIST) injection 19 mL  19 mL Intravenous Once PRN Anson Fret, MD        Review of Systems Pertinent items noted in HPI and remainder of comprehensive ROS otherwise negative.  Exam   Vitals:   09/26/18 0829  BP: 119/78  Pulse: 90  Weight: 215 lb (97.5 kg)      Uterus:     Pelvic Exam: Perineum: no hemorrhoids, normal  perineum   Vulva: normal external genitalia, no lesions   Vagina:  normal mucosa, normal discharge   Cervix: no lesions and normal, pap smear done.    Adnexa: normal adnexa and no mass, fullness, tenderness   Bony Pelvis: average  System: General: well-developed, well-nourished female in no acute distress   Breasts:  normal appearance, no masses or tenderness bilaterally   Skin: normal coloration and turgor, no rashes   Neurologic: oriented, normal, negative, normal mood   Extremities: normal strength, tone, and muscle mass, ROM of all joints is normal   HEENT PERRLA, extraocular movement intact and sclera clear, anicteric   Mouth/Teeth mucous membranes moist, pharynx normal without lesions and dental hygiene good   Neck supple and no masses   Cardiovascular: regular rate and rhythm   Respiratory:  no respiratory distress, normal breath sounds   Abdomen: soft, non-tender; bowel sounds normal;  no masses,  no organomegaly   Bedside Ultrasound for FHR check: 160 bpm.  Viable SIUP with CRL ~ [redacted]w[redacted]d which is consistent with LMP dating. Patient informed that the ultrasound is considered a limited obstetric ultrasound and is not intended to be a complete ultrasound exam.  Patient also informed that the ultrasound is not being completed with the intent of assessing for fetal or placental anomalies or any pelvic abnormalities.  Explained that the purpose of today's ultrasound is to assess for fetal heart rate.  Patient acknowledges the purpose of the exam and the limitations of the study.    Assessment:   Pregnancy: G2P1001 Patient Active Problem List   Diagnosis Date Noted  . Supervision of other normal pregnancy, antepartum 09/09/2018  . Obesity affecting pregnancy 09/09/2018  . Vitamin D deficiency 10/01/2017  . Migraines 09/10/2017     Plan:  1. Encounter for fetal anatomic survey - [place] MFM OB COMP + 14 WK; Future  2. Obesity affecting pregnancy, antepartum - Hemoglobin A1c - Comprehensive metabolic panel - aspirin EC 81 MG tablet; Take 1 tablet (81 mg total) by mouth daily. Take after 12 weeks for prevention of preeclampsia later in pregnancy  Dispense: 300 tablet; Refill: 2  3. Supervision of other normal pregnancy, antepartum - Obstetric Panel, Including HIV - Enroll Patient in Babyscripts - [place] MFM Fetal Nuchal Translucency; Future - Babyscripts Schedule Optimization - [place] MFM OB COMP + 14 WK; Future Initial labs drawn. Continue prenatal vitamins. Genetic Screening discussed, First trimester screen: ordered. Ultrasound discussed; fetal anatomic survey: ordered. Patient interested in Babyscripts optimized schedule, she was enrolled in this.  Problem list reviewed and updated. The nature of [facility] with multiple MDs and other Advanced Practice Providers was explained to patient; also emphasized that residents, students are part of our  team. Routine obstetric precautions reviewed. Return in about 2 weeks (around 10/10/2018) for OB 12 week visit (Babyscripts).     Jaynie Collins, MD, FACOG Obstetrician & Gynecologist, [facility] for [employer], [facility]

## 2018-09-25 ENCOUNTER — Telehealth: Payer: Self-pay | Admitting: Radiology

## 2018-09-25 NOTE — Telephone Encounter (Signed)
Called patient and left message to call [id number], patient is scheduled for 09/26/18 for New OB, however, pt was seen at [facility] on 09/09/18, explained that patient actually does not need to been seen until the week of 10/07/18, asked that she call office to reschedule this New OB appointment.

## 2018-09-26 ENCOUNTER — Encounter: Payer: Self-pay | Admitting: Obstetrics & Gynecology

## 2018-09-26 ENCOUNTER — Ambulatory Visit (INDEPENDENT_AMBULATORY_CARE_PROVIDER_SITE_OTHER): Payer: BLUE CROSS/BLUE SHIELD | Admitting: Obstetrics & Gynecology

## 2018-09-26 VITALS — BP 119/78 | HR 90 | Wt 215.0 lb

## 2018-09-26 DIAGNOSIS — Z3480 Encounter for supervision of other normal pregnancy, unspecified trimester: Secondary | ICD-10-CM | POA: Diagnosis not present

## 2018-09-26 DIAGNOSIS — Z3A10 10 weeks gestation of pregnancy: Secondary | ICD-10-CM

## 2018-09-26 DIAGNOSIS — O99210 Obesity complicating pregnancy, unspecified trimester: Secondary | ICD-10-CM

## 2018-09-26 DIAGNOSIS — Z3689 Encounter for other specified antenatal screening: Secondary | ICD-10-CM

## 2018-09-26 MED ORDER — ASPIRIN EC 81 MG PO TBEC: 81.0000 mg | DELAYED_RELEASE_TABLET | Freq: Every day | ORAL | 2 refills | Status: DC

## 2018-09-27 LAB — COMPREHENSIVE METABOLIC PANEL
ALT: 28 IU/L (ref 0–32)
AST: 20 IU/L (ref 0–40)
Albumin/Globulin Ratio: 1.6 (ref 1.2–2.2)
Albumin: 4.3 g/dL (ref 3.8–4.8)
Alkaline Phosphatase: 59 IU/L (ref 39–117)
BUN/Creatinine Ratio: 12 (ref 9–23)
BUN: 8 mg/dL (ref 6–20)
Bilirubin Total: 0.3 mg/dL (ref 0.0–1.2)
CHLORIDE: 104 mmol/L (ref 96–106)
CO2: 20 mmol/L (ref 20–29)
Calcium: 9.6 mg/dL (ref 8.7–10.2)
Creatinine, Ser: 0.68 mg/dL (ref 0.57–1.00)
GFR calc Af Amer: 133 mL/min/{1.73_m2} (ref >59)
GFR calc non Af Amer: 115 mL/min/{1.73_m2} (ref >59)
Globulin, Total: 2.7 g/dL (ref 1.5–4.5)
Glucose: 86 mg/dL (ref 65–99)
Potassium: 4.3 mmol/L (ref 3.5–5.2)
Sodium: 139 mmol/L (ref 134–144)
Total Protein: 7 g/dL (ref 6.0–8.5)

## 2018-09-27 LAB — OBSTETRIC PANEL, INCLUDING HIV
Antibody Screen: NEGATIVE
BASOS ABS: 0 10*3/uL (ref 0.0–0.2)
Basos: 1 %
EOS (ABSOLUTE): 0.1 10*3/uL (ref 0.0–0.4)
Eos: 1 %
HIV Screen 4th Generation wRfx: NONREACTIVE
Hematocrit: 41.2 % (ref 34.0–46.6)
Hemoglobin: 13.8 g/dL (ref 11.1–15.9)
Hepatitis B Surface Ag: NEGATIVE
Immature Grans (Abs): 0 10*3/uL (ref 0.0–0.1)
Immature Granulocytes: 0 %
Lymphocytes Absolute: 0.8 10*3/uL (ref 0.7–3.1)
Lymphs: 16 %
MCH: 30.2 pg (ref 26.6–33.0)
MCHC: 33.5 g/dL (ref 31.5–35.7)
MCV: 90 fL (ref 79–97)
Monocytes Absolute: 0.3 10*3/uL (ref 0.1–0.9)
Monocytes: 7 %
Neutrophils Absolute: 3.8 10*3/uL (ref 1.4–7.0)
Neutrophils: 75 %
Platelets: 234 10*3/uL (ref 150–450)
RBC: 4.57 x10E6/uL (ref 3.77–5.28)
RDW: 12.2 % (ref 11.7–15.4)
RPR Ser Ql: NONREACTIVE
Rh Factor: POSITIVE
Rubella Antibodies, IGG: 2.86 index (ref >0.99)
WBC: 5.1 10*3/uL (ref 3.4–10.8)

## 2018-09-27 LAB — HEMOGLOBIN A1C
ESTIMATED AVERAGE GLUCOSE: 94 mg/dL
Hgb A1c MFr Bld: 4.9 % (ref 4.8–5.6)

## 2018-10-05 NOTE — Progress Notes (Signed)
Subjective:    Patient ID: Brooke Mclaughlin, female    DOB: [birth year], 33 y.o.   MRN: [id number]  HPI:  Brooke Mclaughlin is here for CPE She is [redacted] weeks pregnant, confirmed with OB/GYN She had A1c, CMP, OB Panel, STI Screening completed 09/2018- WNL She needs fasting labs She estimates to drink >80 oz water/day and has been craving steak, estimates to eas 3-4 times/week  Healthcare Maintenance: PAP-UTD, last 05/01/16, normal Immunizations-UTD  Patient Care Team    Relationship Specialty Notifications Start End  William Hamburgeranford, Keryl Gholson D, NP PCP - General Family Medicine  08/29/17   York SpanielWillis, Charles K, MD Consulting Physician Neurology  09/10/17   Trenda Mootsopland, Alicia B, PA-C Referring Physician Obstetrics and Gynecology  09/10/17     Patient Active Problem List   Diagnosis Date Noted  . Supervision of other normal pregnancy, antepartum 09/09/2018  . Obesity affecting pregnancy 09/09/2018  . Vitamin D deficiency 10/01/2017  . Healthcare maintenance 09/10/2017  . Migraines 09/10/2017     Past Medical History:  Diagnosis Date  . Eczema   . Frequent headaches   . History of cold sores    No genital lesions  . History of Papanicolaou smear of cervix 12/31/13; 05/01/16   NEG; -/-  . Irregular menses   . Migraines   . Palpitations   . Seasonal allergies 10/01/2017  . Syncope 2003   has felt that way since but did not faint  . Urticaria   . UTI (lower urinary tract infection)      Past Surgical History:  Procedure Laterality Date  . ADENOIDECTOMY    . TONSILECTOMY, ADENOIDECTOMY, BILATERAL MYRINGOTOMY AND TUBES Bilateral   . TYMPANOSTOMY TUBE PLACEMENT       Family History  Problem Relation Age of Onset  . Hypertension Mother        RUNS ON MAT SIDE OF FAMILY  . Migraines Father   . Hypertension Maternal Grandmother   . Heart disease Maternal Grandmother   . Heart attack Maternal Grandmother 28  . Heart disease Maternal Aunt   . Heart attack Maternal Aunt   . Hypertension Maternal Aunt    . Heart disease Maternal Uncle   . Heart attack Maternal Uncle   . Diabetes Maternal Uncle   . Hypertension Maternal Uncle      Social History   Substance and Sexual Activity  Drug Use No     Social History   Substance and Sexual Activity  Alcohol Use Yes  . Alcohol/week: 6.0 standard drinks  . Types: 6 Cans of beer per week     Social History   Tobacco Use  Smoking Status Never Smoker  Smokeless Tobacco Never Used     Outpatient Encounter Medications as of 10/08/2018  Medication Sig Note  . aspirin EC 81 MG tablet Take 1 tablet (81 mg total) by mouth daily. Take after 12 weeks for prevention of preeclampsia later in pregnancy   . loratadine (CLARITIN) 10 MG tablet Take 10 mg by mouth daily.   . Prenatal Vit-Fe Fumarate-FA (MULTIVITAMIN-PRENATAL) 27-0.8 MG TABS tablet Take 1 tablet by mouth daily at 12 noon.   . triamcinolone ointment (KENALOG) 0.1 % Apply 1 application topically 2 (two) times daily. 09/09/2018: As needed    Facility-Administered Encounter Medications as of 10/08/2018  Medication  . gadopentetate dimeglumine (MAGNEVIST) injection 19 mL    Allergies: Patient has no known allergies.  Body mass index is 36.21 kg/m.  Blood pressure 105/72, pulse 77, temperature 97.8 F (36.6  C), temperature source Oral, height 5' 4.25" (1.632 m), weight 212 lb 9.6 oz (96.4 kg), last menstrual period 07/18/2018, SpO2 99 %.     Review of Systems  Constitutional: Positive for fatigue. Negative for activity change, appetite change, chills, diaphoresis, fever and unexpected weight change.  HENT: Positive for congestion.   Respiratory: Negative for cough, chest tightness, shortness of breath, wheezing and stridor.   Cardiovascular: Negative for chest pain, palpitations and leg swelling.  Gastrointestinal: Negative for abdominal distention, abdominal pain, blood in stool, constipation, diarrhea, nausea, rectal pain and vomiting.  Endocrine: Negative for cold  intolerance, heat intolerance, polydipsia, polyphagia and polyuria.  Genitourinary: Negative for difficulty urinating, dysuria, flank pain and frequency.  Musculoskeletal: Negative for arthralgias, back pain, gait problem, joint swelling, myalgias, neck pain and neck stiffness.  Skin: Negative for color change, pallor, rash and wound.  Neurological: Positive for headaches. Negative for dizziness.  Hematological: Does not bruise/bleed easily.  Psychiatric/Behavioral: Negative for agitation, behavioral problems, confusion, decreased concentration, dysphoric mood, hallucinations, self-injury, sleep disturbance and suicidal ideas. The patient is not nervous/anxious and is not hyperactive.        Objective:   Physical Exam Vitals signs and nursing note reviewed.  Constitutional:      General: She is not in acute distress.    Appearance: She is obese. She is not ill-appearing or toxic-appearing.  HENT:     Head: Normocephalic and atraumatic.     Right Ear: Tympanic membrane, ear canal and external ear normal. There is no impacted cerumen.     Left Ear: Tympanic membrane, ear canal and external ear normal. There is no impacted cerumen.     Nose: Nose normal. No congestion or rhinorrhea.     Mouth/Throat:     Mouth: Mucous membranes are moist.     Pharynx: No oropharyngeal exudate or posterior oropharyngeal erythema.  Eyes:     General: No scleral icterus.       Right eye: No discharge.        Left eye: No discharge.     Extraocular Movements: Extraocular movements intact.     Conjunctiva/sclera: Conjunctivae normal.     Pupils: Pupils are equal, round, and reactive to light.  Neck:     Musculoskeletal: Normal range of motion.  Cardiovascular:     Rate and Rhythm: Normal rate.     Pulses: Normal pulses.     Heart sounds: Normal heart sounds. No murmur. No friction rub. No gallop.   Pulmonary:     Effort: Pulmonary effort is normal. No respiratory distress.     Breath sounds: Normal  breath sounds. No wheezing, rhonchi or rales.  Chest:     Chest wall: No tenderness.  Abdominal:     General: Abdomen is protuberant. Bowel sounds are normal. There is no distension.     Palpations: Abdomen is soft. There is no mass.     Tenderness: There is no abdominal tenderness. There is no right CVA tenderness, left CVA tenderness, guarding or rebound.     Hernia: No hernia is present.  Musculoskeletal: Normal range of motion.  Lymphadenopathy:     Cervical: No cervical adenopathy.  Skin:    General: Skin is warm and dry.  Neurological:     Mental Status: She is alert and oriented to person, place, and time.  Psychiatric:        Mood and Affect: Mood normal.        Thought Content: Thought content normal.  Judgment: Judgment normal.       Assessment & Plan:   1. Healthcare maintenance   2. Obesity affecting pregnancy, antepartum     Healthcare maintenance Overall you recent labs look good! Please schedule fasting lab appt at your convenience to have your cholesterol panel completed. Please continue to drink plenty of water, follow heart healthy diet, and remain as active as possible. CONGRATULATIONS ON YOUR PREGNANCY! Recommend annual physical with fasting labs.  Obesity affecting pregnancy Body mass index is 36.21 kg/m.  Current wt 212 Advised to follow heart healthy diet, reduce saturated fat intake, ie steak intake Walk as often as possible     FOLLOW-UP:  Return in about 1 year (around 10/09/2019) for CPE, Fasting Labs.

## 2018-10-08 ENCOUNTER — Encounter: Payer: BLUE CROSS/BLUE SHIELD | Admitting: Adult Health

## 2018-10-08 ENCOUNTER — Ambulatory Visit (INDEPENDENT_AMBULATORY_CARE_PROVIDER_SITE_OTHER): Payer: BLUE CROSS/BLUE SHIELD | Admitting: Adult Health

## 2018-10-08 ENCOUNTER — Encounter: Payer: Self-pay | Admitting: Adult Health

## 2018-10-08 VITALS — BP 105/72 | HR 77 | Temp 97.8°F | Ht 64.25 in | Wt 212.6 lb

## 2018-10-08 DIAGNOSIS — Z0000 Encounter for general adult medical examination without abnormal findings: Secondary | ICD-10-CM

## 2018-10-08 DIAGNOSIS — O99210 Obesity complicating pregnancy, unspecified trimester: Secondary | ICD-10-CM | POA: Diagnosis not present

## 2018-10-08 NOTE — Assessment & Plan Note (Signed)
Overall you recent labs look good! Please schedule fasting lab appt at your convenience to have your cholesterol panel completed. Please continue to drink plenty of water, follow heart healthy diet, and remain as active as possible. CONGRATULATIONS ON YOUR PREGNANCY! Recommend annual physical with fasting labs.

## 2018-10-08 NOTE — Assessment & Plan Note (Signed)
Body mass index is 36.21 kg/m.  Current wt 212 Advised to follow heart healthy diet, reduce saturated fat intake, ie steak intake Walk as often as possible

## 2018-10-08 NOTE — Patient Instructions (Signed)
Preventive Care for Adults, Female  A healthy lifestyle and preventive care can promote health and wellness. Preventive health guidelines for women include the following key practices.   A routine yearly physical is a good way to check with your health care provider about your health and preventive screening. It is a chance to share any concerns and updates on your health and to receive a thorough exam.   Visit your dentist for a routine exam and preventive care every 6 months. Brush your teeth twice a day and floss once a day. Good oral hygiene prevents tooth decay and gum disease.   The frequency of eye exams is based on your age, health, family medical history, use of contact lenses, and other factors. Follow your health care provider's recommendations for frequency of eye exams.   Eat a healthy diet. Foods like vegetables, fruits, whole grains, low-fat dairy products, and lean protein foods contain the nutrients you need without too many calories. Decrease your intake of foods high in solid fats, added sugars, and salt. Eat the right amount of calories for you.Get information about a proper diet from your health care provider, if necessary.   Regular physical exercise is one of the most important things you can do for your health. Most adults should get at least 150 minutes of moderate-intensity exercise (any activity that increases your heart rate and causes you to sweat) each week. In addition, most adults need muscle-strengthening exercises on 2 or more days a week.   Maintain a healthy weight. The body mass index (BMI) is a screening tool to identify possible weight problems. It provides an estimate of body fat based on height and weight. Your health care provider can find your BMI, and can help you achieve or maintain a healthy weight.For adults 20 years and older:   - A BMI below 18.5 is considered underweight.   - A BMI of 18.5 to 24.9 is normal.   - A BMI of 25 to 29.9 is  considered overweight.   - A BMI of 30 and above is considered obese.   Maintain normal blood lipids and cholesterol levels by exercising and minimizing your intake of trans and saturated fats.  Eat a balanced diet with plenty of fruit and vegetables. Blood tests for lipids and cholesterol should begin at age 20 and be repeated every 5 years minimum.  If your lipid or cholesterol levels are high, you are over 40, or you are at high risk for heart disease, you may need your cholesterol levels checked more frequently.Ongoing high lipid and cholesterol levels should be treated with medicines if diet and exercise are not working.   If you smoke, find out from your health care provider how to quit. If you do not use tobacco, do not start.   Lung cancer screening is recommended for adults aged 55-80 years who are at high risk for developing lung cancer because of a history of smoking. A yearly low-dose CT scan of the lungs is recommended for people who have at least a 30-pack-year history of smoking and are a current smoker or have quit within the past 15 years. A pack year of smoking is smoking an average of 1 pack of cigarettes a day for 1 year (for example: 1 pack a day for 30 years or 2 packs a day for 15 years). Yearly screening should continue until the smoker has stopped smoking for at least 15 years. Yearly screening should be stopped for people who develop a   health problem that would prevent them from having lung cancer treatment.   If you are pregnant, do not drink alcohol. If you are breastfeeding, be very cautious about drinking alcohol. If you are not pregnant and choose to drink alcohol, do not have more than 1 drink per day. One drink is considered to be 12 ounces (355 mL) of beer, 5 ounces (148 mL) of wine, or 1.5 ounces (44 mL) of liquor.   Avoid use of street drugs. Do not share needles with anyone. Ask for help if you need support or instructions about stopping the use of  drugs.   High blood pressure causes heart disease and increases the risk of stroke. Your blood pressure should be checked at least yearly.  Ongoing high blood pressure should be treated with medicines if weight loss and exercise do not work.   If you are 69-55 years old, ask your health care provider if you should take aspirin to prevent strokes.   Diabetes screening involves taking a blood sample to check your fasting blood sugar level. This should be done once every 3 years, after age 38, if you are within normal weight and without risk factors for diabetes. Testing should be considered at a younger age or be carried out more frequently if you are overweight and have at least 1 risk factor for diabetes.   Breast cancer screening is essential preventive care for women. You should practice "breast self-awareness."  This means understanding the normal appearance and feel of your breasts and may include breast self-examination.  Any changes detected, no matter how small, should be reported to a health care provider.  Women in their 80s and 30s should have a clinical breast exam (CBE) by a health care provider as part of a regular health exam every 1 to 3 years.  After age 66, women should have a CBE every year.  Starting at age 1, women should consider having a mammogram (breast X-ray test) every year.  Women who have a family history of breast cancer should talk to their health care provider about genetic screening.  Women at a high risk of breast cancer should talk to their health care providers about having an MRI and a mammogram every year.   -Breast cancer gene (BRCA)-related cancer risk assessment is recommended for women who have family members with BRCA-related cancers. BRCA-related cancers include breast, ovarian, tubal, and peritoneal cancers. Having family members with these cancers may be associated with an increased risk for harmful changes (mutations) in the breast cancer genes BRCA1 and  BRCA2. Results of the assessment will determine the need for genetic counseling and BRCA1 and BRCA2 testing.   The Pap test is a screening test for cervical cancer. A Pap test can show cell changes on the cervix that might become cervical cancer if left untreated. A Pap test is a procedure in which cells are obtained and examined from the lower end of the uterus (cervix).   - Women should have a Pap test starting at age 57.   - Between ages 90 and 70, Pap tests should be repeated every 2 years.   - Beginning at age 63, you should have a Pap test every 3 years as long as the past 3 Pap tests have been normal.   - Some women have medical problems that increase the chance of getting cervical cancer. Talk to your health care provider about these problems. It is especially important to talk to your health care provider if a  new problem develops soon after your last Pap test. In these cases, your health care provider may recommend more frequent screening and Pap tests.   - The above recommendations are the same for women who have or have not gotten the vaccine for human papillomavirus (HPV).   - If you had a hysterectomy for a problem that was not cancer or a condition that could lead to cancer, then you no longer need Pap tests. Even if you no longer need a Pap test, a regular exam is a good idea to make sure no other problems are starting.   - If you are between ages 36 and 66 years, and you have had normal Pap tests going back 10 years, you no longer need Pap tests. Even if you no longer need a Pap test, a regular exam is a good idea to make sure no other problems are starting.   - If you have had past treatment for cervical cancer or a condition that could lead to cancer, you need Pap tests and screening for cancer for at least 20 years after your treatment.   - If Pap tests have been discontinued, risk factors (such as a new sexual partner) need to be reassessed to determine if screening should  be resumed.   - The HPV test is an additional test that may be used for cervical cancer screening. The HPV test looks for the virus that can cause the cell changes on the cervix. The cells collected during the Pap test can be tested for HPV. The HPV test could be used to screen women aged 70 years and older, and should be used in women of any age who have unclear Pap test results. After the age of 67, women should have HPV testing at the same frequency as a Pap test.   Colorectal cancer can be detected and often prevented. Most routine colorectal cancer screening begins at the age of 57 years and continues through age 26 years. However, your health care provider may recommend screening at an earlier age if you have risk factors for colon cancer. On a yearly basis, your health care provider may provide home test kits to check for hidden blood in the stool.  Use of a small camera at the end of a tube, to directly examine the colon (sigmoidoscopy or colonoscopy), can detect the earliest forms of colorectal cancer. Talk to your health care provider about this at age 23, when routine screening begins. Direct exam of the colon should be repeated every 5 -10 years through age 49 years, unless early forms of pre-cancerous polyps or small growths are found.   People who are at an increased risk for hepatitis B should be screened for this virus. You are considered at high risk for hepatitis B if:  -You were born in a country where hepatitis B occurs often. Talk with your health care provider about which countries are considered high risk.  - Your parents were born in a high-risk country and you have not received a shot to protect against hepatitis B (hepatitis B vaccine).  - You have HIV or AIDS.  - You use needles to inject street drugs.  - You live with, or have sex with, someone who has Hepatitis B.  - You get hemodialysis treatment.  - You take certain medicines for conditions like cancer, organ  transplantation, and autoimmune conditions.   Hepatitis C blood testing is recommended for all people born from [birth year] through 1965 and any individual  with known risks for hepatitis C.   Practice safe sex. Use condoms and avoid high-risk sexual practices to reduce the spread of sexually transmitted infections (STIs). STIs include gonorrhea, chlamydia, syphilis, trichomonas, herpes, HPV, and human immunodeficiency virus (HIV). Herpes, HIV, and HPV are viral illnesses that have no cure. They can result in disability, cancer, and death. Sexually active women aged 25 years and younger should be checked for chlamydia. Older women with new or multiple partners should also be tested for chlamydia. Testing for other STIs is recommended if you are sexually active and at increased risk.   Osteoporosis is a disease in which the bones lose minerals and strength with aging. This can result in serious bone fractures or breaks. The risk of osteoporosis can be identified using a bone density scan. Women ages 65 years and over and women at risk for fractures or osteoporosis should discuss screening with their health care providers. Ask your health care provider whether you should take a calcium supplement or vitamin D to There are also several preventive steps women can take to avoid osteoporosis and resulting fractures or to keep osteoporosis from worsening. -->Recommendations include:  Eat a balanced diet high in fruits, vegetables, calcium, and vitamins.  Get enough calcium. The recommended total intake of is 1,200 mg daily; for best absorption, if taking supplements, divide doses into 250-500 mg doses throughout the day. Of the two types of calcium, calcium carbonate is best absorbed when taken with food but calcium citrate can be taken on an empty stomach.  Get enough vitamin D. NAMS and the National Osteoporosis Foundation recommend at least 1,000 IU per day for women age 50 and over who are at risk of vitamin D  deficiency. Vitamin D deficiency can be caused by inadequate sun exposure (for example, those who live in northern latitudes).  Avoid alcohol and smoking. Heavy alcohol intake (more than 7 drinks per week) increases the risk of falls and hip fracture and women smokers tend to lose bone more rapidly and have lower bone mass than nonsmokers. Stopping smoking is one of the most important changes women can make to improve their health and decrease risk for disease.  Be physically active every day. Weight-bearing exercise (for example, fast walking, hiking, jogging, and weight training) may strengthen bones or slow the rate of bone loss that comes with aging. Balancing and muscle-strengthening exercises can reduce the risk of falling and fracture.  Consider therapeutic medications. Currently, several types of effective drugs are available. Healthcare providers can recommend the type most appropriate for each woman.  Eliminate environmental factors that may contribute to accidents. Falls cause nearly 90% of all osteoporotic fractures, so reducing this risk is an important bone-health strategy. Measures include ample lighting, removing obstructions to walking, using nonskid rugs on floors, and placing mats and/or grab bars in showers.  Be aware of medication side effects. Some common medicines make bones weaker. These include a type of steroid drug called glucocorticoids used for arthritis and asthma, some antiseizure drugs, certain sleeping pills, treatments for endometriosis, and some cancer drugs. An overactive thyroid gland or using too much thyroid hormone for an underactive thyroid can also be a problem. If you are taking these medicines, talk to your doctor about what you can do to help protect your bones.reduce the rate of osteoporosis.    Menopause can be associated with physical symptoms and risks. Hormone replacement therapy is available to decrease symptoms and risks. You should talk to your  health care provider   about whether hormone replacement therapy is right for you.   Use sunscreen. Apply sunscreen liberally and repeatedly throughout the day. You should seek shade when your shadow is shorter than you. Protect yourself by wearing long sleeves, pants, a wide-brimmed hat, and sunglasses year round, whenever you are outdoors.   Once a month, do a whole body skin exam, using a mirror to look at the skin on your back. Tell your health care provider of new moles, moles that have irregular borders, moles that are larger than a pencil eraser, or moles that have changed in shape or color.   -Stay current with required vaccines (immunizations).   Influenza vaccine. All adults should be immunized every year.  Tetanus, diphtheria, and acellular pertussis (Td, Tdap) vaccine. Pregnant women should receive 1 dose of Tdap vaccine during each pregnancy. The dose should be obtained regardless of the length of time since the last dose. Immunization is preferred during the 27th 36th week of gestation. An adult who has not previously received Tdap or who does not know her vaccine status should receive 1 dose of Tdap. This initial dose should be followed by tetanus and diphtheria toxoids (Td) booster doses every 10 years. Adults with an unknown or incomplete history of completing a 3-dose immunization series with Td-containing vaccines should begin or complete a primary immunization series including a Tdap dose. Adults should receive a Td booster every 10 years.  Varicella vaccine. An adult without evidence of immunity to varicella should receive 2 doses or a second dose if she has previously received 1 dose. Pregnant females who do not have evidence of immunity should receive the first dose after pregnancy. This first dose should be obtained before leaving the health care facility. The second dose should be obtained 4 8 weeks after the first dose.  Human papillomavirus (HPV) vaccine. Females aged 13 26  years who have not received the vaccine previously should obtain the 3-dose series. The vaccine is not recommended for use in pregnant females. However, pregnancy testing is not needed before receiving a dose. If a female is found to be pregnant after receiving a dose, no treatment is needed. In that case, the remaining doses should be delayed until after the pregnancy. Immunization is recommended for any person with an immunocompromised condition through the age of 26 years if she did not get any or all doses earlier. During the 3-dose series, the second dose should be obtained 4 8 weeks after the first dose. The third dose should be obtained 24 weeks after the first dose and 16 weeks after the second dose.  Zoster vaccine. One dose is recommended for adults aged 60 years or older unless certain conditions are present.  Measles, mumps, and rubella (MMR) vaccine. Adults born before 1957 generally are considered immune to measles and mumps. Adults born in 1957 or later should have 1 or more doses of MMR vaccine unless there is a contraindication to the vaccine or there is laboratory evidence of immunity to each of the three diseases. A routine second dose of MMR vaccine should be obtained at least 28 days after the first dose for students attending postsecondary schools, health care workers, or international travelers. People who received inactivated measles vaccine or an unknown type of measles vaccine during 1963 1967 should receive 2 doses of MMR vaccine. People who received inactivated mumps vaccine or an unknown type of mumps vaccine before 1979 and are at high risk for mumps infection should consider immunization with 2 doses of   MMR vaccine. For females of childbearing age, rubella immunity should be determined. If there is no evidence of immunity, females who are not pregnant should be vaccinated. If there is no evidence of immunity, females who are pregnant should delay immunization until after pregnancy.  Unvaccinated health care workers born before [birth year] who lack laboratory evidence of measles, mumps, or rubella immunity or laboratory confirmation of disease should consider measles and mumps immunization with 2 doses of MMR vaccine or rubella immunization with 1 dose of MMR vaccine.  Pneumococcal 13-valent conjugate (PCV13) vaccine. When indicated, a person who is uncertain of her immunization history and has no record of immunization should receive the PCV13 vaccine. An adult aged 54 years or older who has certain medical conditions and has not been previously immunized should receive 1 dose of PCV13 vaccine. This PCV13 should be followed with a dose of pneumococcal polysaccharide (PPSV23) vaccine. The PPSV23 vaccine dose should be obtained at least 8 weeks after the dose of PCV13 vaccine. An adult aged 58 years or older who has certain medical conditions and previously received 1 or more doses of PPSV23 vaccine should receive 1 dose of PCV13. The PCV13 vaccine dose should be obtained 1 or more years after the last PPSV23 vaccine dose.  Pneumococcal polysaccharide (PPSV23) vaccine. When PCV13 is also indicated, PCV13 should be obtained first. All adults aged 58 years and older should be immunized. An adult younger than age 65 years who has certain medical conditions should be immunized. Any person who resides in a nursing home or long-term care facility should be immunized. An adult smoker should be immunized. People with an immunocompromised condition and certain other conditions should receive both PCV13 and PPSV23 vaccines. People with human immunodeficiency virus (HIV) infection should be immunized as soon as possible after diagnosis. Immunization during chemotherapy or radiation therapy should be avoided. Routine use of PPSV23 vaccine is not recommended for American Indians, [place] Natives, or people younger than 65 years unless there are medical conditions that require PPSV23 vaccine. When indicated,  people who have unknown immunization and have no record of immunization should receive PPSV23 vaccine. One-time revaccination 5 years after the first dose of PPSV23 is recommended for people aged 70 64 years who have chronic kidney failure, nephrotic syndrome, asplenia, or immunocompromised conditions. People who received 1 2 doses of PPSV23 before age 32 years should receive another dose of PPSV23 vaccine at age 96 years or later if at least 5 years have passed since the previous dose. Doses of PPSV23 are not needed for people immunized with PPSV23 at or after age 55 years.  Meningococcal vaccine. Adults with asplenia or persistent complement component deficiencies should receive 2 doses of quadrivalent meningococcal conjugate (MenACWY-D) vaccine. The doses should be obtained at least 2 months apart. Microbiologists working with certain meningococcal bacteria, [employer] recruits, people at risk during an outbreak, and people who travel to or live in countries with a high rate of meningitis should be immunized. A first-year college student up through age 58 years who is living in a residence hall should receive a dose if she did not receive a dose on or after her 16th birthday. Adults who have certain high-risk conditions should receive one or more doses of vaccine.  Hepatitis A vaccine. Adults who wish to be protected from this disease, have certain high-risk conditions, work with hepatitis A-infected animals, work in hepatitis A research labs, or travel to or work in countries with a high rate of hepatitis A should be  immunized. Adults who were previously unvaccinated and who anticipate close contact with an international adoptee during the first 60 days after arrival in the [place] from a country with a high rate of hepatitis A should be immunized.  Hepatitis B vaccine.  Adults who wish to be protected from this disease, have certain high-risk conditions, may be exposed to blood or other infectious  body fluids, are household contacts or sex partners of hepatitis B positive people, are clients or workers in certain care facilities, or travel to or work in countries with a high rate of hepatitis B should be immunized.  Haemophilus influenzae type b (Hib) vaccine. A previously unvaccinated person with asplenia or sickle cell disease or having a scheduled splenectomy should receive 1 dose of Hib vaccine. Regardless of previous immunization, a recipient of a hematopoietic stem cell transplant should receive a 3-dose series 6 12 months after her successful transplant. Hib vaccine is not recommended for adults with HIV infection.  Preventive Services / Frequency Ages 6 to 39years  Blood pressure check.** / Every 1 to 2 years.  Lipid and cholesterol check.** / Every 5 years beginning at age 39.  Clinical breast exam.** / Every 3 years for women in their 61s and 62s.  BRCA-related cancer risk assessment.** / For women who have family members with a BRCA-related cancer (breast, ovarian, tubal, or peritoneal cancers).  Pap test.** / Every 2 years from ages 47 through 85. Every 3 years starting at age 34 through age 12 or 74 with a history of 3 consecutive normal Pap tests.  HPV screening.** / Every 3 years from ages 46 through ages 43 to 54 with a history of 3 consecutive normal Pap tests.  Hepatitis C blood test.** / For any individual with known risks for hepatitis C.  Skin self-exam. / Monthly.  Influenza vaccine. / Every year.  Tetanus, diphtheria, and acellular pertussis (Tdap, Td) vaccine.** / Consult your health care provider. Pregnant women should receive 1 dose of Tdap vaccine during each pregnancy. 1 dose of Td every 10 years.  Varicella vaccine.** / Consult your health care provider. Pregnant females who do not have evidence of immunity should receive the first dose after pregnancy.  HPV vaccine. / 3 doses over 6 months, if 64 and younger. The vaccine is not recommended for use in  pregnant females. However, pregnancy testing is not needed before receiving a dose.  Measles, mumps, rubella (MMR) vaccine.** / You need at least 1 dose of MMR if you were born in 1957 or later. You may also need a 2nd dose. For females of childbearing age, rubella immunity should be determined. If there is no evidence of immunity, females who are not pregnant should be vaccinated. If there is no evidence of immunity, females who are pregnant should delay immunization until after pregnancy.  Pneumococcal 13-valent conjugate (PCV13) vaccine.** / Consult your health care provider.  Pneumococcal polysaccharide (PPSV23) vaccine.** / 1 to 2 doses if you smoke cigarettes or if you have certain conditions.  Meningococcal vaccine.** / 1 dose if you are age 71 to 36 years and a Market researcher living in a residence hall, or have one of several medical conditions, you need to get vaccinated against meningococcal disease. You may also need additional booster doses.  Hepatitis A vaccine.** / Consult your health care provider.  Hepatitis B vaccine.** / Consult your health care provider.  Haemophilus influenzae type b (Hib) vaccine.** / Consult your health care provider.  Ages 55 to 64years  Blood pressure check.** / Every 1 to 2 years.  Lipid and cholesterol check.** / Every 5 years beginning at age 20 years.  Lung cancer screening. / Every year if you are aged 55 80 years and have a 30-pack-year history of smoking and currently smoke or have quit within the past 15 years. Yearly screening is stopped once you have quit smoking for at least 15 years or develop a health problem that would prevent you from having lung cancer treatment.  Clinical breast exam.** / Every year after age 40 years.  BRCA-related cancer risk assessment.** / For women who have family members with a BRCA-related cancer (breast, ovarian, tubal, or peritoneal cancers).  Mammogram.** / Every year beginning at age 40  years and continuing for as long as you are in good health. Consult with your health care provider.  Pap test.** / Every 3 years starting at age 30 years through age 65 or 70 years with a history of 3 consecutive normal Pap tests.  HPV screening.** / Every 3 years from ages 30 years through ages 65 to 70 years with a history of 3 consecutive normal Pap tests.  Fecal occult blood test (FOBT) of stool. / Every year beginning at age 50 years and continuing until age 75 years. You may not need to do this test if you get a colonoscopy every 10 years.  Flexible sigmoidoscopy or colonoscopy.** / Every 5 years for a flexible sigmoidoscopy or every 10 years for a colonoscopy beginning at age 50 years and continuing until age 75 years.  Hepatitis C blood test.** / For all people born from 1945 through 1965 and any individual with known risks for hepatitis C.  Skin self-exam. / Monthly.  Influenza vaccine. / Every year.  Tetanus, diphtheria, and acellular pertussis (Tdap/Td) vaccine.** / Consult your health care provider. Pregnant women should receive 1 dose of Tdap vaccine during each pregnancy. 1 dose of Td every 10 years.  Varicella vaccine.** / Consult your health care provider. Pregnant females who do not have evidence of immunity should receive the first dose after pregnancy.  Zoster vaccine.** / 1 dose for adults aged 60 years or older.  Measles, mumps, rubella (MMR) vaccine.** / You need at least 1 dose of MMR if you were born in 1957 or later. You may also need a 2nd dose. For females of childbearing age, rubella immunity should be determined. If there is no evidence of immunity, females who are not pregnant should be vaccinated. If there is no evidence of immunity, females who are pregnant should delay immunization until after pregnancy.  Pneumococcal 13-valent conjugate (PCV13) vaccine.** / Consult your health care provider.  Pneumococcal polysaccharide (PPSV23) vaccine.** / 1 to 2 doses if  you smoke cigarettes or if you have certain conditions.  Meningococcal vaccine.** / Consult your health care provider.  Hepatitis A vaccine.** / Consult your health care provider.  Hepatitis B vaccine.** / Consult your health care provider.  Haemophilus influenzae type b (Hib) vaccine.** / Consult your health care provider.  Ages 65 years and over  Blood pressure check.** / Every 1 to 2 years.  Lipid and cholesterol check.** / Every 5 years beginning at age 20 years.  Lung cancer screening. / Every year if you are aged 55 80 years and have a 30-pack-year history of smoking and currently smoke or have quit within the past 15 years. Yearly screening is stopped once you have quit smoking for at least 15 years or develop a health problem that   would prevent you from having lung cancer treatment.  Clinical breast exam.** / Every year after age 103 years.  BRCA-related cancer risk assessment.** / For women who have family members with a BRCA-related cancer (breast, ovarian, tubal, or peritoneal cancers).  Mammogram.** / Every year beginning at age 36 years and continuing for as long as you are in good health. Consult with your health care provider.  Pap test.** / Every 3 years starting at age 5 years through age 85 or 10 years with 3 consecutive normal Pap tests. Testing can be stopped between 65 and 70 years with 3 consecutive normal Pap tests and no abnormal Pap or HPV tests in the past 10 years.  HPV screening.** / Every 3 years from ages 93 years through ages 70 or 45 years with a history of 3 consecutive normal Pap tests. Testing can be stopped between 65 and 70 years with 3 consecutive normal Pap tests and no abnormal Pap or HPV tests in the past 10 years.  Fecal occult blood test (FOBT) of stool. / Every year beginning at age 8 years and continuing until age 45 years. You may not need to do this test if you get a colonoscopy every 10 years.  Flexible sigmoidoscopy or colonoscopy.** /  Every 5 years for a flexible sigmoidoscopy or every 10 years for a colonoscopy beginning at age 69 years and continuing until age 68 years.  Hepatitis C blood test.** / For all people born from [birth year] through 1966 and any individual with known risks for hepatitis C.  Osteoporosis screening.** / A one-time screening for women ages 7 years and over and women at risk for fractures or osteoporosis.  Skin self-exam. / Monthly.  Influenza vaccine. / Every year.  Tetanus, diphtheria, and acellular pertussis (Tdap/Td) vaccine.** / 1 dose of Td every 10 years.  Varicella vaccine.** / Consult your health care provider.  Zoster vaccine.** / 1 dose for adults aged 5 years or older.  Pneumococcal 13-valent conjugate (PCV13) vaccine.** / Consult your health care provider.  Pneumococcal polysaccharide (PPSV23) vaccine.** / 1 dose for all adults aged 74 years and older.  Meningococcal vaccine.** / Consult your health care provider.  Hepatitis A vaccine.** / Consult your health care provider.  Hepatitis B vaccine.** / Consult your health care provider.  Haemophilus influenzae type b (Hib) vaccine.** / Consult your health care provider. ** Family history and personal history of risk and conditions may change your health care provider's recommendations. Document Released: 10/03/2001 Document Revised: 05/28/2013  [facility] Patient Information 2014 [place], [place].   EXERCISE AND DIET:  We recommended that you start or continue a regular exercise program for good health. Regular exercise means any activity that makes your heart beat faster and makes you sweat.  We recommend exercising at least 30 minutes per day at least 3 days a week, preferably 5.  We also recommend a diet low in fat and sugar / carbohydrates.  Inactivity, poor dietary choices and obesity can cause diabetes, heart attack, stroke, and kidney damage, among others.     ALCOHOL AND SMOKING:  Women should limit their alcohol intake to no  more than 7 drinks/beers/glasses of wine (combined, not each!) per week. Moderation of alcohol intake to this level decreases your risk of breast cancer and liver damage.  ( And of course, no recreational drugs are part of a healthy lifestyle.)  Also, you should not be smoking at all or even being exposed to second hand smoke. Most people know smoking can  cause cancer, and various heart and lung diseases, but did you know it also contributes to weakening of your bones?  Aging of your skin?  Yellowing of your teeth and nails?   CALCIUM AND VITAMIN D:  Adequate intake of calcium and Vitamin D are recommended.  The recommendations for exact amounts of these supplements seem to change often, but generally speaking 600 mg of calcium (either carbonate or citrate) and 800 units of Vitamin D per day seems prudent. Certain women may benefit from higher intake of Vitamin D.  If you are among these women, your doctor will have told you during your visit.     PAP SMEARS:  Pap smears, to check for cervical cancer or precancers,  have traditionally been done yearly, although recent scientific advances have shown that most women can have pap smears less often.  However, every woman still should have a physical exam from her gynecologist or primary care physician every year. It will include a breast check, inspection of the vulva and vagina to check for abnormal growths or skin changes, a visual exam of the cervix, and then an exam to evaluate the size and shape of the uterus and ovaries.  And after 34 years of age, a rectal exam is indicated to check for rectal cancers. We will also provide age appropriate advice regarding health maintenance, like when you should have certain vaccines, screening for sexually transmitted diseases, bone density testing, colonoscopy, mammograms, etc.    MAMMOGRAMS:  All women over 32 years old should have a yearly mammogram. Many facilities now offer a "3D" mammogram, which may cost  around $50 extra out of pocket. If possible,  we recommend you accept the option to have the 3D mammogram performed.  It both reduces the number of women who will be called back for extra views which then turn out to be normal, and it is better than the routine mammogram at detecting truly abnormal areas.     COLONOSCOPY:  Colonoscopy to screen for colon cancer is recommended for all women at age 68.  We know, you hate the idea of the prep.  We agree, BUT, having colon cancer and not knowing it is worse!!  Colon cancer so often starts as a polyp that can be seen and removed at colonscopy, which can quite literally save your life!  And if your first colonoscopy is normal and you have no family history of colon cancer, most women don't have to have it again for 10 years.  Once every ten years, you can do something that may end up saving your life, right?  We will be happy to help you get it scheduled when you are ready.  Be sure to check your insurance coverage so you understand how much it will cost.  It may be covered as a preventative service at no cost, but you should check your particular policy.   Overall you recent labs look good! Please schedule fasting lab appt at your convenience to have your cholesterol panel completed. Please continue to drink plenty of water, follow heart healthy diet, and remain as active as possible. CONGRATULATIONS ON YOUR PREGNANCY! Recommend annual physical with fasting labs. GREAT TO SEE YOU!

## 2018-10-10 ENCOUNTER — Ambulatory Visit (INDEPENDENT_AMBULATORY_CARE_PROVIDER_SITE_OTHER): Payer: BLUE CROSS/BLUE SHIELD | Admitting: Obstetrics and Gynecology

## 2018-10-10 VITALS — BP 113/75 | HR 72 | Wt 213.4 lb

## 2018-10-10 DIAGNOSIS — E669 Obesity, unspecified: Secondary | ICD-10-CM | POA: Insufficient documentation

## 2018-10-10 DIAGNOSIS — Z3A12 12 weeks gestation of pregnancy: Secondary | ICD-10-CM

## 2018-10-10 DIAGNOSIS — O99211 Obesity complicating pregnancy, first trimester: Secondary | ICD-10-CM

## 2018-10-10 DIAGNOSIS — Z3481 Encounter for supervision of other normal pregnancy, first trimester: Secondary | ICD-10-CM

## 2018-10-10 DIAGNOSIS — Z3480 Encounter for supervision of other normal pregnancy, unspecified trimester: Secondary | ICD-10-CM

## 2018-10-10 NOTE — Progress Notes (Signed)
Prenatal Visit Note Date: 10/10/2018 Clinic: [facility]  Subjective:  Brooke Mclaughlin is a 33 y.o. G2P1001 at [redacted]w[redacted]d being seen today for ongoing prenatal care.  She is currently monitored for the following issues for this low-risk pregnancy and has Healthcare maintenance; Migraines; Vitamin D deficiency; Supervision of other normal pregnancy, antepartum; Obesity affecting pregnancy; and Obesity (BMI 35.0-39.9 without comorbidity) on their problem list.  Patient reports no complaints.   Contractions: Not present. Vag. Bleeding: None.   . Denies leaking of fluid.   The following portions of the patient's history were reviewed and updated as appropriate: allergies, current medications, past family history, past medical history, past social history, past surgical history and problem list. Problem list updated.  Objective:   Vitals:   10/10/18 0809  BP: 113/75  Pulse: 72  Weight: 213 lb 6.4 oz (96.8 kg)    Fetal Status: Fetal Heart Rate (bpm): 150s         General:  Alert, oriented and cooperative. Patient is in no acute distress.  Skin: Skin is warm and dry. No rash noted.   Cardiovascular: Normal heart rate noted  Respiratory: Normal respiratory effort, no problems with respiration noted  Abdomen: Soft, gravid, appropriate for gestational age. Pain/Pressure: Absent     Pelvic:  Cervical exam deferred        Extremities: Normal range of motion.  Edema: None  Mental Status: Normal mood and affect. Normal behavior. Normal judgment and thought content.   Urinalysis:      Assessment and Plan:  Pregnancy: G2P1001 at [redacted]w[redacted]d  1. Supervision of other normal pregnancy, antepartum Routine care. nt scan early march scheduled. Offer afp next time. Set up anatomy u/s if not done at nt scan  2. Obesity (BMI 35.0-39.9 without comorbidity) Weight stable  Preterm labor symptoms and general obstetric precautions including but not limited to vaginal bleeding, contractions,  leaking of fluid and fetal movement were reviewed in detail with the patient. Please refer to After Visit Summary for other counseling recommendations.  No follow-ups on file.per baby scripts   Alcolu Bing, MD

## 2018-10-16 ENCOUNTER — Other Ambulatory Visit (INDEPENDENT_AMBULATORY_CARE_PROVIDER_SITE_OTHER): Payer: BLUE CROSS/BLUE SHIELD

## 2018-10-16 ENCOUNTER — Other Ambulatory Visit: Payer: Self-pay

## 2018-10-16 DIAGNOSIS — Z0000 Encounter for general adult medical examination without abnormal findings: Secondary | ICD-10-CM | POA: Diagnosis not present

## 2018-10-16 DIAGNOSIS — E559 Vitamin D deficiency, unspecified: Secondary | ICD-10-CM

## 2018-10-17 LAB — TSH: TSH: 1.02 u[IU]/mL (ref 0.450–4.500)

## 2018-10-17 LAB — LIPID PANEL
Chol/HDL Ratio: 3.2 ratio (ref 0.0–4.4)
Cholesterol, Total: 162 mg/dL (ref 100–199)
HDL: 50 mg/dL (ref >39)
LDL Calculated: 85 mg/dL (ref 0–99)
Triglycerides: 135 mg/dL (ref 0–149)
VLDL Cholesterol Cal: 27 mg/dL (ref 5–40)

## 2018-10-17 LAB — CBC WITH DIFFERENTIAL/PLATELET
Basophils Absolute: 0 10*3/uL (ref 0.0–0.2)
Basos: 1 %
EOS (ABSOLUTE): 0.1 10*3/uL (ref 0.0–0.4)
Eos: 2 %
Hematocrit: 36.6 % (ref 34.0–46.6)
Hemoglobin: 13.1 g/dL (ref 11.1–15.9)
IMMATURE GRANULOCYTES: 0 %
Immature Grans (Abs): 0 10*3/uL (ref 0.0–0.1)
Lymphocytes Absolute: 1 10*3/uL (ref 0.7–3.1)
Lymphs: 20 %
MCH: 31.8 pg (ref 26.6–33.0)
MCHC: 35.8 g/dL — ABNORMAL HIGH (ref 31.5–35.7)
MCV: 89 fL (ref 79–97)
Monocytes Absolute: 0.3 10*3/uL (ref 0.1–0.9)
Monocytes: 6 %
NEUTROS PCT: 71 %
Neutrophils Absolute: 3.7 10*3/uL (ref 1.4–7.0)
Platelets: 226 10*3/uL (ref 150–450)
RBC: 4.12 x10E6/uL (ref 3.77–5.28)
RDW: 11.7 % (ref 11.7–15.4)
WBC: 5.1 10*3/uL (ref 3.4–10.8)

## 2018-10-17 LAB — VITAMIN D 25 HYDROXY (VIT D DEFICIENCY, FRACTURES): Vit D, 25-Hydroxy: 20.5 ng/mL — ABNORMAL LOW (ref 30.0–100.0)

## 2018-10-22 ENCOUNTER — Ambulatory Visit (HOSPITAL_COMMUNITY): Payer: BLUE CROSS/BLUE SHIELD

## 2018-10-22 ENCOUNTER — Ambulatory Visit (HOSPITAL_COMMUNITY)
Admission: RE | Admit: 2018-10-22 | Discharge: 2018-10-22 | Disposition: A | Discharge status: Home / Self Care | Payer: BLUE CROSS/BLUE SHIELD | Source: Ambulatory Visit | Attending: Obstetrics & Gynecology | Admitting: Obstetrics & Gynecology

## 2018-10-22 ENCOUNTER — Ambulatory Visit (HOSPITAL_COMMUNITY): Payer: BLUE CROSS/BLUE SHIELD | Admitting: *Deleted

## 2018-10-22 ENCOUNTER — Encounter (HOSPITAL_COMMUNITY): Payer: Self-pay

## 2018-10-22 VITALS — BP 130/62 | HR 83 | Wt 215.4 lb

## 2018-10-22 DIAGNOSIS — Z3480 Encounter for supervision of other normal pregnancy, unspecified trimester: Secondary | ICD-10-CM | POA: Diagnosis not present

## 2018-10-22 DIAGNOSIS — O99211 Obesity complicating pregnancy, first trimester: Secondary | ICD-10-CM | POA: Diagnosis not present

## 2018-10-22 DIAGNOSIS — O99210 Obesity complicating pregnancy, unspecified trimester: Secondary | ICD-10-CM | POA: Insufficient documentation

## 2018-10-22 DIAGNOSIS — Z3A13 13 weeks gestation of pregnancy: Secondary | ICD-10-CM

## 2018-10-22 DIAGNOSIS — Z3682 Encounter for antenatal screening for nuchal translucency: Secondary | ICD-10-CM | POA: Diagnosis not present

## 2018-10-23 ENCOUNTER — Other Ambulatory Visit (HOSPITAL_COMMUNITY): Payer: Self-pay | Admitting: *Deleted

## 2018-10-23 DIAGNOSIS — O99210 Obesity complicating pregnancy, unspecified trimester: Secondary | ICD-10-CM

## 2018-10-28 ENCOUNTER — Other Ambulatory Visit (HOSPITAL_COMMUNITY): Payer: Self-pay | Admitting: Obstetrics and Gynecology

## 2018-12-05 ENCOUNTER — Ambulatory Visit (INDEPENDENT_AMBULATORY_CARE_PROVIDER_SITE_OTHER): Payer: BLUE CROSS/BLUE SHIELD | Admitting: Obstetrics and Gynecology

## 2018-12-05 ENCOUNTER — Other Ambulatory Visit: Payer: Self-pay

## 2018-12-05 ENCOUNTER — Encounter: Payer: Self-pay | Admitting: Obstetrics and Gynecology

## 2018-12-05 VITALS — BP 122/77 | HR 105 | Wt 216.0 lb

## 2018-12-05 DIAGNOSIS — R109 Unspecified abdominal pain: Secondary | ICD-10-CM | POA: Diagnosis not present

## 2018-12-05 DIAGNOSIS — O26892 Other specified pregnancy related conditions, second trimester: Secondary | ICD-10-CM

## 2018-12-05 DIAGNOSIS — Z3A20 20 weeks gestation of pregnancy: Secondary | ICD-10-CM

## 2018-12-05 DIAGNOSIS — Z3480 Encounter for supervision of other normal pregnancy, unspecified trimester: Secondary | ICD-10-CM | POA: Diagnosis not present

## 2018-12-05 DIAGNOSIS — O26899 Other specified pregnancy related conditions, unspecified trimester: Secondary | ICD-10-CM

## 2018-12-05 LAB — POCT URINALYSIS DIPSTICK

## 2018-12-05 NOTE — Progress Notes (Signed)
Prenatal Visit Note Date: 12/05/2018 Clinic: [facility]  Subjective:  Brooke Mclaughlin is a 33 y.o. G2P1001 at [redacted]w[redacted]d being seen today for ongoing prenatal care.  She is currently monitored for the following issues for this low-risk pregnancy and has Healthcare maintenance; Migraines; Vitamin D deficiency; Supervision of other normal pregnancy, antepartum; Obesity affecting pregnancy; and Obesity (BMI 35.0-39.9 without comorbidity) on their problem list.  Patient reports occasional cramping, no LUTS s/s. no VB, LOF vaginitis.   Contractions: Irritability. Vag. Bleeding: None.  Movement: Present. Denies leaking of fluid.   The following portions of the patient's history were reviewed and updated as appropriate: allergies, current medications, past family history, past medical history, past social history, past surgical history and problem list. Problem list updated.  Objective:   Vitals:   12/05/18 0816  BP: 122/77  Pulse: (!) 105  Weight: 216 lb (98 kg)    Fetal Status: Fetal Heart Rate (bpm): 147   Movement: Present     General:  Alert, oriented and cooperative. Patient is in no acute distress.  Skin: Skin is warm and dry. No rash noted.   Cardiovascular: Normal heart rate noted  Respiratory: Normal respiratory effort, no problems with respiration noted  Abdomen: Soft, gravid, appropriate for gestational age. Pain/Pressure: Absent     Pelvic:  Cervical exam performed        Extremities: Normal range of motion.  Edema: None  Mental Status: Normal mood and affect. Normal behavior. Normal judgment and thought content.   Urinalysis:    +blood  Assessment and Plan:  Pregnancy: G2P1001 at [redacted]w[redacted]d  1. Supervision of other normal pregnancy, antepartum Routine care. Has anatomy u/s - POCT Urinalysis Dipstick - AFP, Serum, Open Spina Bifida  2. Cramping affecting pregnancy, antepartum Will await ucx results first.  - POCT Urinalysis Dipstick - Culture, OB  Urine  Preterm labor symptoms and general obstetric precautions including but not limited to vaginal bleeding, contractions, leaking of fluid and fetal movement were reviewed in detail with the patient. Please refer to After Visit Summary for other counseling recommendations.  Return in about 2 months (around 02/04/2019) for rob and 2h GTT.    Bing, MD

## 2018-12-05 NOTE — Progress Notes (Signed)
Pt having cramping and lower back pain

## 2018-12-06 LAB — CULTURE, OB URINE

## 2018-12-06 LAB — URINE CULTURE, OB REFLEX

## 2018-12-07 LAB — AFP, SERUM, OPEN SPINA BIFIDA
AFP MoM: 0.88
AFP Value: 39.7 ng/mL
Gest. Age on Collection Date: 20 weeks
Maternal Age At EDD: 33.7 yr
OSBR Risk 1 IN: 10000
Test Results:: NEGATIVE
Weight: 216 [lb_av]

## 2018-12-10 ENCOUNTER — Ambulatory Visit (HOSPITAL_COMMUNITY): Payer: BLUE CROSS/BLUE SHIELD

## 2018-12-11 ENCOUNTER — Encounter (HOSPITAL_COMMUNITY): Payer: Self-pay

## 2018-12-11 ENCOUNTER — Ambulatory Visit (HOSPITAL_COMMUNITY): Payer: BLUE CROSS/BLUE SHIELD | Admitting: *Deleted

## 2018-12-11 ENCOUNTER — Ambulatory Visit (HOSPITAL_COMMUNITY)
Admission: RE | Admit: 2018-12-11 | Discharge: 2018-12-11 | Disposition: A | Discharge status: Home / Self Care | Payer: BLUE CROSS/BLUE SHIELD | Source: Ambulatory Visit | Attending: Obstetrics and Gynecology | Admitting: Obstetrics and Gynecology

## 2018-12-11 ENCOUNTER — Other Ambulatory Visit: Payer: Self-pay

## 2018-12-11 VITALS — Temp 98.0°F

## 2018-12-11 DIAGNOSIS — O99210 Obesity complicating pregnancy, unspecified trimester: Secondary | ICD-10-CM

## 2018-12-11 DIAGNOSIS — Z3A20 20 weeks gestation of pregnancy: Secondary | ICD-10-CM | POA: Diagnosis not present

## 2018-12-11 DIAGNOSIS — Z363 Encounter for antenatal screening for malformations: Secondary | ICD-10-CM | POA: Diagnosis not present

## 2018-12-11 DIAGNOSIS — O99212 Obesity complicating pregnancy, second trimester: Secondary | ICD-10-CM | POA: Diagnosis not present

## 2018-12-12 ENCOUNTER — Other Ambulatory Visit (HOSPITAL_COMMUNITY): Payer: Self-pay | Admitting: *Deleted

## 2018-12-12 DIAGNOSIS — Z362 Encounter for other antenatal screening follow-up: Secondary | ICD-10-CM

## 2019-01-08 ENCOUNTER — Ambulatory Visit (HOSPITAL_COMMUNITY): Payer: BLUE CROSS/BLUE SHIELD

## 2019-01-08 ENCOUNTER — Other Ambulatory Visit: Payer: Self-pay

## 2019-01-08 ENCOUNTER — Ambulatory Visit (HOSPITAL_COMMUNITY)
Admission: RE | Admit: 2019-01-08 | Discharge: 2019-01-08 | Disposition: A | Discharge status: Home / Self Care | Payer: BLUE CROSS/BLUE SHIELD | Source: Ambulatory Visit | Attending: Obstetrics and Gynecology | Admitting: Obstetrics and Gynecology

## 2019-01-08 DIAGNOSIS — Z362 Encounter for other antenatal screening follow-up: Secondary | ICD-10-CM | POA: Diagnosis not present

## 2019-01-08 DIAGNOSIS — O99212 Obesity complicating pregnancy, second trimester: Secondary | ICD-10-CM

## 2019-01-08 DIAGNOSIS — Z3A24 24 weeks gestation of pregnancy: Secondary | ICD-10-CM | POA: Diagnosis not present

## 2019-01-14 ENCOUNTER — Telehealth: Payer: Self-pay | Admitting: *Deleted

## 2019-01-14 NOTE — Telephone Encounter (Signed)
Left message for pt to call us back regarding her message

## 2019-01-14 NOTE — Telephone Encounter (Signed)
Called pt back in regards to her message, pt states she is having pelvic pain X 1 week and its hurts to stand and not when she sits. Pt denies any vaginal bleeding, discharge, or urinary issues. Will discuss with provider and call her back. Will call pt to get her in to be seen.

## 2019-01-15 ENCOUNTER — Ambulatory Visit (INDEPENDENT_AMBULATORY_CARE_PROVIDER_SITE_OTHER): Payer: BLUE CROSS/BLUE SHIELD | Admitting: Family Medicine

## 2019-01-15 ENCOUNTER — Other Ambulatory Visit: Payer: Self-pay

## 2019-01-15 VITALS — BP 118/73 | HR 101 | Wt 224.0 lb

## 2019-01-15 DIAGNOSIS — Z3A25 25 weeks gestation of pregnancy: Secondary | ICD-10-CM

## 2019-01-15 DIAGNOSIS — Z3480 Encounter for supervision of other normal pregnancy, unspecified trimester: Secondary | ICD-10-CM

## 2019-01-15 DIAGNOSIS — R102 Pelvic and perineal pain: Secondary | ICD-10-CM

## 2019-01-15 DIAGNOSIS — O26892 Other specified pregnancy related conditions, second trimester: Secondary | ICD-10-CM

## 2019-01-15 LAB — POCT URINALYSIS DIPSTICK
Blood, UA: NEGATIVE
Leukocytes, UA: NEGATIVE

## 2019-01-15 NOTE — Progress Notes (Signed)
Pt states she has been having pelvic pain for a week, denies any vaginal bleeding, urinary symptoms. Has had some vaginal discharge.

## 2019-01-15 NOTE — Progress Notes (Signed)
   PRENATAL VISIT NOTE  Subjective:  Brooke Mclaughlin is a 33 y.o. G2P1001 at [redacted]w[redacted]d being seen today for ongoing prenatal care.  She is currently monitored for the following issues for this low-risk pregnancy and has Healthcare maintenance; Migraines; Vitamin D deficiency; Supervision of other normal pregnancy, antepartum; Obesity affecting pregnancy; and Obesity (BMI 35.0-39.9 without comorbidity) on their problem list.  Patient reports pressure and pelvic pain.  Contractions: Not present. Vag. Bleeding: None.  Movement: Present. Denies leaking of fluid.   The following portions of the patient's history were reviewed and updated as appropriate: allergies, current medications, past family history, past medical history, past social history, past surgical history and problem list.   Objective:   Vitals:   01/15/19 1550  BP: 118/73  Pulse: (!) 101  Weight: 224 lb (101.6 kg)    Fetal Status: Fetal Heart Rate (bpm): 155 Fundal Height: 26 cm Movement: Present  Presentation: Vertex  General:  Alert, oriented and cooperative. Patient is in no acute distress.  Skin: Skin is warm and dry. No rash noted.   Cardiovascular: Normal heart rate noted  Respiratory: Normal respiratory effort, no problems with respiration noted  Abdomen: Soft, gravid, appropriate for gestational age.  Pain/Pressure: Present     Pelvic: Cervical exam deferred        Extremities: Normal range of motion.  Edema: None  Mental Status: Normal mood and affect. Normal behavior. Normal judgment and thought content.   Assessment and Plan:  Pregnancy: G2P1001 at [redacted]w[redacted]d 1. Supervision of other normal pregnancy, antepartum -Likely pain is positional, infant feesl VTX at this visit? Though this is limited by GA.  Reviewed spinning babies back stretches and website. No discharge and did not perform vaginal testing today.  -Reviewed ligment stretching increases with every pregnancy and leads to more pelvic instability.  - POCT Urinalysis  Dipstick  2. Pelvic pain affecting pregnancy in second trimester, antepartum - POCT Urinalysis Dipstick  Preterm labor symptoms and general obstetric precautions including but not limited to vaginal bleeding, contractions, leaking of fluid and fetal movement were reviewed in detail with the patient. Please refer to After Visit Summary for other counseling recommendations.   Return in about 2 weeks (around 01/29/2019).  Future Appointments  Date Time Provider Department Center  01/30/2019  8:15 AM [place] Bing, MD [facility]  10/09/2019  8:15 AM Danford, Jinny Blossom, NP [facility] None    Federico Flake, MD

## 2019-01-30 ENCOUNTER — Ambulatory Visit (INDEPENDENT_AMBULATORY_CARE_PROVIDER_SITE_OTHER): Payer: BC Managed Care – PPO | Admitting: Obstetrics and Gynecology

## 2019-01-30 ENCOUNTER — Other Ambulatory Visit: Payer: Self-pay

## 2019-01-30 VITALS — BP 108/74 | HR 90 | Wt 226.0 lb

## 2019-01-30 DIAGNOSIS — Z23 Encounter for immunization: Secondary | ICD-10-CM

## 2019-01-30 DIAGNOSIS — Z3480 Encounter for supervision of other normal pregnancy, unspecified trimester: Secondary | ICD-10-CM | POA: Diagnosis not present

## 2019-01-30 DIAGNOSIS — Z3A28 28 weeks gestation of pregnancy: Secondary | ICD-10-CM

## 2019-01-30 DIAGNOSIS — Z3483 Encounter for supervision of other normal pregnancy, third trimester: Secondary | ICD-10-CM

## 2019-01-30 NOTE — Progress Notes (Signed)
Prenatal Visit Note Date: 01/30/2019 Clinic: [facility]  Subjective:  Brooke Mclaughlin is a 33 y.o. G2P1001 at [redacted]w[redacted]d being seen today for ongoing prenatal care.  She is currently monitored for the following issues for this low-risk pregnancy and has Healthcare maintenance; Migraines; Vitamin D deficiency; Supervision of other normal pregnancy, antepartum; Obesity affecting pregnancy; and Obesity (BMI 35.0-39.9 without comorbidity) on their problem list.  Patient reports low belly pain and pressure. no VB, spotting, LUTs s/s.   Contractions: Not present. Vag. Bleeding: None.  Movement: Present. Denies leaking of fluid.   The following portions of the patient's history were reviewed and updated as appropriate: allergies, current medications, past family history, past medical history, past social history, past surgical history and problem list. Problem list updated.  Objective:   Vitals:   01/30/19 0826  BP: 108/74  Pulse: 90  Weight: 226 lb (102.5 kg)    Fetal Status: Fetal Heart Rate (bpm): 148 Fundal Height: 29 cm Movement: Present     General:  Alert, oriented and cooperative. Patient is in no acute distress.  Skin: Skin is warm and dry. No rash noted.   Cardiovascular: Normal heart rate noted  Respiratory: Normal respiratory effort, no problems with respiration noted  Abdomen: Soft, gravid, appropriate for gestational age. Pain/Pressure: Present     Pelvic:  Cervical exam deferred        Extremities: Normal range of motion.  Edema: Trace  Mental Status: Normal mood and affect. Normal behavior. Normal judgment and thought content.   Urinalysis:      Assessment and Plan:  Pregnancy: G2P1001 at [redacted]w[redacted]d  1. Supervision of other normal pregnancy, antepartum Routine care. Recommend belly wrap - Glucose Tolerance, 2 Hours w/1 Hour - CBC - RPR - HIV Antibody (routine testing w rflx)  Preterm labor symptoms and general obstetric precautions including but not  limited to vaginal bleeding, contractions, leaking of fluid and fetal movement were reviewed in detail with the patient. Please refer to After Visit Summary for other counseling recommendations.  Return in about 4 weeks (around 02/27/2019).   Aletha Halim, MD

## 2019-01-31 LAB — CBC
Hematocrit: 34.6 % (ref 34.0–46.6)
Hemoglobin: 11.6 g/dL (ref 11.1–15.9)
MCH: 30.2 pg (ref 26.6–33.0)
MCHC: 33.5 g/dL (ref 31.5–35.7)
MCV: 90 fL (ref 79–97)
Platelets: 236 10*3/uL (ref 150–450)
RBC: 3.84 x10E6/uL (ref 3.77–5.28)
RDW: 12.8 % (ref 11.7–15.4)
WBC: 9.4 10*3/uL (ref 3.4–10.8)

## 2019-01-31 LAB — GLUCOSE TOLERANCE, 2 HOURS W/ 1HR
Glucose, 1 hour: 120 mg/dL (ref 65–179)
Glucose, 2 hour: 101 mg/dL (ref 65–152)
Glucose, Fasting: 81 mg/dL (ref 65–91)

## 2019-01-31 LAB — HIV ANTIBODY (ROUTINE TESTING W REFLEX): HIV Screen 4th Generation wRfx: NONREACTIVE

## 2019-01-31 LAB — RPR: RPR Ser Ql: NONREACTIVE

## 2019-02-27 ENCOUNTER — Encounter: Payer: Self-pay | Admitting: Obstetrics and Gynecology

## 2019-02-27 ENCOUNTER — Telehealth (INDEPENDENT_AMBULATORY_CARE_PROVIDER_SITE_OTHER): Payer: BC Managed Care – PPO | Admitting: Obstetrics and Gynecology

## 2019-02-27 ENCOUNTER — Other Ambulatory Visit: Payer: Self-pay

## 2019-02-27 VITALS — Wt 227.0 lb

## 2019-02-27 DIAGNOSIS — Z3480 Encounter for supervision of other normal pregnancy, unspecified trimester: Secondary | ICD-10-CM

## 2019-02-27 DIAGNOSIS — Z3483 Encounter for supervision of other normal pregnancy, third trimester: Secondary | ICD-10-CM

## 2019-02-27 DIAGNOSIS — Z3A32 32 weeks gestation of pregnancy: Secondary | ICD-10-CM

## 2019-02-27 NOTE — Progress Notes (Signed)
   TELEHEALTH VIRTUAL OBSTETRICS VISIT ENCOUNTER NOTE  I connected with Brooke Mclaughlin on 02/27/19 at  8:30 AM EDT by telephone at home and verified that I am speaking with the correct person using two identifiers.   I discussed the limitations, risks, security and privacy concerns of performing an evaluation and management service by telephone and the availability of in person appointments. I also discussed with the patient that there may be a patient responsible charge related to this service. The patient expressed understanding and agreed to proceed.  Subjective:  Brooke Mclaughlin is a 33 y.o. G2P1001 at [redacted]w[redacted]d being followed for ongoing prenatal care.  She is currently monitored for the following issues for this low-risk pregnancy and has Healthcare maintenance; Migraines; Vitamin D deficiency; Supervision of other normal pregnancy, antepartum; Obesity affecting pregnancy; and Obesity (BMI 35.0-39.9 without comorbidity) on their problem list.  Patient reports no complaints. Reports fetal movement. Denies any contractions, bleeding or leaking of fluid.   The following portions of the patient's history were reviewed and updated as appropriate: allergies, current medications, past family history, past medical history, past social history, past surgical history and problem list.   Objective:   Vitals:   02/27/19 0825  Weight: 227 lb (103 kg)    Babyscripts Data Reviewed: yes  General:  Alert, oriented and cooperative.   Mental Status: Normal mood and affect perceived. Normal judgment and thought content.  Rest of physical exam deferred due to type of encounter  Assessment and Plan:  Pregnancy: G2P1001 at [redacted]w[redacted]d 1. Supervision of other normal pregnancy, antepartum Routine care. 28wk labs normal gbs nv. D/w pt more re: BC nv.   Preterm labor symptoms and general obstetric precautions including but not limited to vaginal bleeding, contractions, leaking of fluid and fetal movement were reviewed  in detail with the patient.  I discussed the assessment and treatment plan with the patient. The patient was provided an opportunity to ask questions and all were answered. The patient agreed with the plan and demonstrated an understanding of the instructions. The patient was advised to call back or seek an in-person office evaluation/go to [facility] at [facility] for any urgent or concerning symptoms. Please refer to After Visit Summary for other counseling recommendations.   I provided 5 minutes of non-face-to-face time during this encounter. The visit was conducted via MyChart-medicine  Return in about 1 month (around 03/30/2019).  Future Appointments  Date Time Provider [place]  02/27/2019  8:30 AM Aletha Halim, MD [facility]  10/09/2019  8:15 AM Danford, Berna Spare, NP [facility] None    Aletha Halim, MD [facility] [place]

## 2019-02-27 NOTE — Progress Notes (Signed)
I connected with  [name] on 02/27/19 at  8:30 AM EDT by telephone and verified that I am speaking with the correct person using two identifiers.   I discussed the limitations, risks, security and privacy concerns of performing an evaluation and management service by telephone and the availability of in person appointments. I also discussed with the patient that there may be a patient responsible charge related to this service. The patient expressed understanding and agreed to proceed.  Brooke Mclaughlin, CMA 02/27/2019  8:14 AM   Last blood pressure was 02/26/2019 116/78

## 2019-03-27 ENCOUNTER — Other Ambulatory Visit: Payer: Self-pay

## 2019-03-27 ENCOUNTER — Ambulatory Visit (INDEPENDENT_AMBULATORY_CARE_PROVIDER_SITE_OTHER): Payer: BC Managed Care – PPO | Admitting: Family Medicine

## 2019-03-27 ENCOUNTER — Encounter: Payer: Self-pay | Admitting: Adult Health

## 2019-03-27 VITALS — BP 116/75 | HR 86 | Wt 235.0 lb

## 2019-03-27 DIAGNOSIS — Z113 Encounter for screening for infections with a predominantly sexual mode of transmission: Secondary | ICD-10-CM | POA: Diagnosis not present

## 2019-03-27 DIAGNOSIS — Z3480 Encounter for supervision of other normal pregnancy, unspecified trimester: Secondary | ICD-10-CM

## 2019-03-27 DIAGNOSIS — Z3483 Encounter for supervision of other normal pregnancy, third trimester: Secondary | ICD-10-CM

## 2019-03-27 DIAGNOSIS — Z3A36 36 weeks gestation of pregnancy: Secondary | ICD-10-CM

## 2019-03-27 NOTE — Progress Notes (Signed)
   PRENATAL VISIT NOTE  Subjective:  Brooke Mclaughlin is a 33 y.o. G2P1001 at [redacted]w[redacted]d being seen today for ongoing prenatal care.  She is currently monitored for the following issues for this low-risk pregnancy and has Healthcare maintenance; Migraines; Vitamin D deficiency; Supervision of other normal pregnancy, antepartum; Obesity affecting pregnancy; and Obesity (BMI 35.0-39.9 without comorbidity) on their problem list.  Patient reports no complaints.  Contractions: Not present. Vag. Bleeding: None.  Movement: Present. Denies leaking of fluid.   The following portions of the patient's history were reviewed and updated as appropriate: allergies, current medications, past family history, past medical history, past social history, past surgical history and problem list.   Objective:   Vitals:   03/27/19 1346  BP: 116/75  Pulse: 86  Weight: 235 lb (106.6 kg)    Fetal Status: Fetal Heart Rate (bpm): 126 Fundal Height: 35 cm Movement: Present  Presentation: Vertex  General:  Alert, oriented and cooperative. Patient is in no acute distress.  Skin: Skin is warm and dry. No rash noted.   Cardiovascular: Normal heart rate noted  Respiratory: Normal respiratory effort, no problems with respiration noted  Abdomen: Soft, gravid, appropriate for gestational age.  Pain/Pressure: Present     Pelvic: Cervical exam performed Dilation: 1 Effacement (%): Thick Station: Ballotable  Extremities: Normal range of motion.  Edema: Trace  Mental Status: Normal mood and affect. Normal behavior. Normal judgment and thought content.   Assessment and Plan:  Pregnancy: G2P1001 at [redacted]w[redacted]d 1. Supervision of other normal pregnancy, antepartum Cultures today - Culture, beta strep (group b only) - GC/Chlamydia probe amp ([facility])not at [facility]  Preterm labor symptoms and general obstetric precautions including but not limited to vaginal bleeding, contractions, leaking of fluid and fetal movement were reviewed in detail  with the patient. Please refer to After Visit Summary for other counseling recommendations.   Return in 2 weeks (on 04/10/2019) for virtual.  Future Appointments  Date Time Provider [place]  04/10/2019  9:15 AM Caren Macadam, MD [facility]  10/09/2019  8:15 AM Danford, Berna Spare, NP [facility] None    Donnamae Jude, MD

## 2019-03-27 NOTE — Patient Instructions (Signed)

## 2019-03-28 LAB — GC/CHLAMYDIA PROBE AMP (~~LOC~~) NOT AT ARMC
Chlamydia: NEGATIVE
Neisseria Gonorrhea: NEGATIVE

## 2019-03-31 LAB — CULTURE, BETA STREP (GROUP B ONLY): Strep Gp B Culture: NEGATIVE

## 2019-03-31 LAB — OB RESULTS CONSOLE GBS: GBS: NEGATIVE

## 2019-04-10 ENCOUNTER — Telehealth (INDEPENDENT_AMBULATORY_CARE_PROVIDER_SITE_OTHER): Payer: BC Managed Care – PPO | Admitting: Family Medicine

## 2019-04-10 ENCOUNTER — Other Ambulatory Visit: Payer: Self-pay

## 2019-04-10 VITALS — BP 108/71

## 2019-04-10 DIAGNOSIS — Z3A38 38 weeks gestation of pregnancy: Secondary | ICD-10-CM

## 2019-04-10 DIAGNOSIS — O99213 Obesity complicating pregnancy, third trimester: Secondary | ICD-10-CM

## 2019-04-10 DIAGNOSIS — Z3480 Encounter for supervision of other normal pregnancy, unspecified trimester: Secondary | ICD-10-CM

## 2019-04-10 NOTE — Progress Notes (Signed)
I connected with@ on 04/10/19 at  9:15 AM EDT by: Mychart and verified that I am speaking with the correct person using two identifiers.  Patient is located at home and provider is located at [facility].     The purpose of this virtual visit is to provide medical care while limiting exposure to the novel coronavirus. I discussed the limitations, risks, security and privacy concerns of performing an evaluation and management service by video and the availability of in person appointments. I also discussed with the patient that there may be a patient responsible charge related to this service. By engaging in this virtual visit, you consent to the provision of healthcare.  Additionally, you authorize for your insurance to be billed for the services provided during this visit.  The patient expressed understanding and agreed to proceed.  The following staff members participated in the virtual visit:  Crosby Oyster    PRENATAL VISIT NOTE  Subjective:  Brooke Mclaughlin is a 33 y.o. G2P1001 at [redacted]w[redacted]d  for phone visit for ongoing prenatal care.  She is currently monitored for the following issues for this low-risk pregnancy and has Healthcare maintenance; Migraines; Vitamin D deficiency; Supervision of other normal pregnancy, antepartum; Obesity affecting pregnancy; and Obesity (BMI 35.0-39.9 without comorbidity) on their problem list.  Patient reports no complaints.  Contractions: Irregular. Vag. Bleeding: None.  Movement: Present. Denies leaking of fluid.   The following portions of the patient's history were reviewed and updated as appropriate: allergies, current medications, past family history, past medical history, past social history, past surgical history and problem list.   Objective:   Vitals:   04/10/19 0920  BP: 108/71   Self-Obtained  Fetal Status:     Movement: Present     Assessment and Plan:  Pregnancy: G2P1001 at [redacted]w[redacted]d  1. Supervision of other normal pregnancy, antepartum Up to Date  Discussed labor precautions and availability to be seen at office if having mild labor sx Reviewed going to hospital if her water breaks.   2. Obesity affecting pregnancy in third trimester TWG = 25 lb (11.3 kg) - above goal.     Term labor symptoms and general obstetric precautions including but not limited to vaginal bleeding, contractions, leaking of fluid and fetal movement were reviewed in detail with the patient.  Return in about 1 week (around 04/17/2019) for Routine prenatal care, in person.  Future Appointments  Date Time Provider [place]  10/09/2019  8:15 AM Danford, Berna Spare, NP [facility] None     Time spent on virtual visit: 15 minutes  Caren Macadam, MD

## 2019-04-16 ENCOUNTER — Ambulatory Visit (INDEPENDENT_AMBULATORY_CARE_PROVIDER_SITE_OTHER): Payer: BC Managed Care – PPO | Admitting: Advanced Practice Midwife

## 2019-04-16 ENCOUNTER — Other Ambulatory Visit: Payer: Self-pay

## 2019-04-16 VITALS — BP 120/77 | HR 84 | Wt 235.0 lb

## 2019-04-16 DIAGNOSIS — Z3A38 38 weeks gestation of pregnancy: Secondary | ICD-10-CM

## 2019-04-16 DIAGNOSIS — Z3480 Encounter for supervision of other normal pregnancy, unspecified trimester: Secondary | ICD-10-CM

## 2019-04-16 DIAGNOSIS — O99213 Obesity complicating pregnancy, third trimester: Secondary | ICD-10-CM

## 2019-04-16 NOTE — Progress Notes (Signed)
   PRENATAL VISIT NOTE  Subjective:  Brooke Mclaughlin is a 33 y.o. G2P1001 at [redacted]w[redacted]d being seen today for ongoing prenatal care.  She is currently monitored for the following issues for this low-risk pregnancy and has Healthcare maintenance; Migraines; Vitamin D deficiency; Supervision of other normal pregnancy, antepartum; Obesity affecting pregnancy; and Obesity (BMI 35.0-39.9 without comorbidity) on their problem list.  Patient reports no bleeding, no contractions, no cramping and no leaking.  Contractions: Irregular. Vag. Bleeding: None.  Movement: Present. Denies leaking of fluid.   The following portions of the patient's history were reviewed and updated as appropriate: allergies, current medications, past family history, past medical history, past social history, past surgical history and problem list. Problem list updated.  Objective:   Vitals:   04/16/19 1123  BP: 120/77  Pulse: 84  Weight: 235 lb (106.6 kg)    Fetal Status: Fetal Heart Rate (bpm): 134   Movement: Present     General:  Alert, oriented and cooperative. Patient is in no acute distress.  Skin: Skin is warm and dry. No rash noted.   Cardiovascular: Normal heart rate noted  Respiratory: Normal respiratory effort, no problems with respiration noted  Abdomen: Soft, gravid, appropriate for gestational age.  Pain/Pressure: Present     Pelvic: Cervical exam performed Dilation: 1 Effacement (%): Thick Station: -3 Vertex by suture  Extremities: Normal range of motion.  Edema: Trace  Mental Status: Normal mood and affect. Normal behavior. Normal judgment and thought content.   Assessment and Plan:  Pregnancy: G2P1001 at [redacted]w[redacted]d  1. Supervision of other normal pregnancy, antepartum - No complaints or concerns - Discussed Cochrane review, ACOG data regarding membrane sweeping at 39 and 40 weeks. Highlighted risks, benefits including change of accidental AROM, painful contractions without cervical change. Patient agreeable.  Membranes swept. Tolerated well by patient. - Reviewed location of MAU for labor triage. [facility] is closer for patient and feasible option especially if concern for imminent delivery - PDIOL scheduled for 05/01/19. Foley balloon + Cytotec based on today's exam. Orders placed  2. Obesity affecting pregnancy in third trimester - No weight gain from previous visit - FH appropriate  Term labor symptoms and general obstetric precautions including but not limited to vaginal bleeding, contractions, leaking of fluid and fetal movement were reviewed in detail with the patient. Please refer to After Visit Summary for other counseling recommendations.  Return in about 1 week (around 04/23/2019) for 40 week ROB, NST, membrane sweep.  Future Appointments  Date Time Provider [place]  04/23/2019  9:30 AM Darlina Rumpf, [place] [facility]  05/01/2019  7:30 AM [facility] [place] [facility] None  10/09/2019  8:15 AM Danford, Berna Spare, NP [facility] None    Darlina Rumpf, CNM

## 2019-04-16 NOTE — Patient Instructions (Signed)

## 2019-04-17 ENCOUNTER — Encounter: Payer: BC Managed Care – PPO | Admitting: Family Medicine

## 2019-04-17 ENCOUNTER — Encounter (HOSPITAL_COMMUNITY): Payer: Self-pay | Admitting: *Deleted

## 2019-04-17 ENCOUNTER — Telehealth (HOSPITAL_COMMUNITY): Payer: Self-pay | Admitting: *Deleted

## 2019-04-17 NOTE — Telephone Encounter (Signed)
Preadmission screen  

## 2019-04-21 ENCOUNTER — Other Ambulatory Visit: Payer: Self-pay

## 2019-04-21 ENCOUNTER — Inpatient Hospital Stay (HOSPITAL_COMMUNITY)
Admission: AD | Admit: 2019-04-21 | Discharge: 2019-04-23 | DRG: 807 | Disposition: A | Discharge status: Home / Self Care | Payer: BC Managed Care – PPO | Attending: Obstetrics and Gynecology | Admitting: Obstetrics and Gynecology

## 2019-04-21 ENCOUNTER — Inpatient Hospital Stay (HOSPITAL_COMMUNITY): Payer: BC Managed Care – PPO | Admitting: Anesthesiology

## 2019-04-21 ENCOUNTER — Encounter (HOSPITAL_COMMUNITY): Payer: Self-pay

## 2019-04-21 DIAGNOSIS — O99214 Obesity complicating childbirth: Secondary | ICD-10-CM | POA: Diagnosis not present

## 2019-04-21 DIAGNOSIS — Z3A39 39 weeks gestation of pregnancy: Secondary | ICD-10-CM | POA: Diagnosis not present

## 2019-04-21 DIAGNOSIS — O26893 Other specified pregnancy related conditions, third trimester: Secondary | ICD-10-CM | POA: Diagnosis not present

## 2019-04-21 DIAGNOSIS — Z20828 Contact with and (suspected) exposure to other viral communicable diseases: Secondary | ICD-10-CM | POA: Diagnosis not present

## 2019-04-21 DIAGNOSIS — O411230 Chorioamnionitis, third trimester, not applicable or unspecified: Secondary | ICD-10-CM | POA: Diagnosis not present

## 2019-04-21 LAB — SARS CORONAVIRUS 2 BY RT PCR (HOSPITAL ORDER, PERFORMED IN ~~LOC~~ HOSPITAL LAB): SARS Coronavirus 2: NEGATIVE

## 2019-04-21 LAB — CBC
HCT: 37.1 % (ref 36.0–46.0)
Hemoglobin: 12.3 g/dL (ref 12.0–15.0)
MCH: 30.7 pg (ref 26.0–34.0)
MCHC: 33.2 g/dL (ref 30.0–36.0)
MCV: 92.5 fL (ref 80.0–100.0)
Platelets: 203 10*3/uL (ref 150–400)
RBC: 4.01 MIL/uL (ref 3.87–5.11)
RDW: 13.4 % (ref 11.5–15.5)
WBC: 13.4 10*3/uL — ABNORMAL HIGH (ref 4.0–10.5)
nRBC: 0 % (ref 0.0–0.2)

## 2019-04-21 LAB — RPR: RPR Ser Ql: NONREACTIVE

## 2019-04-21 LAB — TYPE AND SCREEN
ABO/RH(D): A POS
Antibody Screen: NEGATIVE

## 2019-04-21 LAB — ABO/RH: ABO/RH(D): A POS

## 2019-04-21 MED ORDER — ONDANSETRON HCL 4 MG/2ML IJ SOLN: 4.0000 mg | Freq: Four times a day (QID) | INTRAMUSCULAR | Status: DC | PRN

## 2019-04-21 MED ORDER — LACTATED RINGERS IV SOLN: 500.0000 mL | INTRAVENOUS | Status: DC | PRN

## 2019-04-21 MED ORDER — SENNOSIDES-DOCUSATE SODIUM 8.6-50 MG PO TABS
2.0000 | ORAL_TABLET | ORAL | Status: DC
  Administered 2019-04-21 – 2019-04-22 (×2): 2 via ORAL
  Filled 2019-04-21 (×2): qty 2

## 2019-04-21 MED ORDER — OXYTOCIN 40 UNITS IN NORMAL SALINE INFUSION - SIMPLE MED: 2.5000 [IU]/h | INTRAVENOUS | Status: DC

## 2019-04-21 MED ORDER — MISOPROSTOL 50MCG HALF TABLET: 50.0000 ug | ORAL_TABLET | ORAL | Status: DC | PRN

## 2019-04-21 MED ORDER — LACTATED RINGERS IV SOLN: 500.0000 mL | Freq: Once | INTRAVENOUS | Status: DC

## 2019-04-21 MED ORDER — FENTANYL-BUPIVACAINE-NACL 0.5-0.125-0.9 MG/250ML-% EP SOLN: 12.0000 mL/h | EPIDURAL | Status: DC | PRN

## 2019-04-21 MED ORDER — LIDOCAINE HCL (PF) 1 % IJ SOLN: 30.0000 mL | INTRAMUSCULAR | Status: DC | PRN

## 2019-04-21 MED ORDER — SOD CITRATE-CITRIC ACID 500-334 MG/5ML PO SOLN: 30.0000 mL | ORAL | Status: DC | PRN

## 2019-04-21 MED ORDER — ONDANSETRON HCL 4 MG/2ML IJ SOLN: 4.0000 mg | INTRAMUSCULAR | Status: DC | PRN

## 2019-04-21 MED ORDER — LACTATED RINGERS IV SOLN
INTRAVENOUS | Status: DC
  Administered 2019-04-21: 08:00:00 via INTRAVENOUS

## 2019-04-21 MED ORDER — BENZOCAINE-MENTHOL 20-0.5 % EX AERO
1.0000 "application " | INHALATION_SPRAY | CUTANEOUS | Status: DC | PRN
  Administered 2019-04-22: 1 via TOPICAL
  Filled 2019-04-21: qty 56

## 2019-04-21 MED ORDER — ACETAMINOPHEN 325 MG PO TABS: 650.0000 mg | ORAL_TABLET | ORAL | Status: DC | PRN

## 2019-04-21 MED ORDER — SODIUM CHLORIDE (PF) 0.9 % IJ SOLN
INTRAMUSCULAR | Status: DC | PRN
  Administered 2019-04-21: 12 mL/h via EPIDURAL

## 2019-04-21 MED ORDER — PHENYLEPHRINE 40 MCG/ML (10ML) SYRINGE FOR IV PUSH (FOR BLOOD PRESSURE SUPPORT): 80.0000 ug | PREFILLED_SYRINGE | INTRAVENOUS | Status: DC | PRN

## 2019-04-21 MED ORDER — LACTATED RINGERS IV SOLN: INTRAVENOUS | Status: DC

## 2019-04-21 MED ORDER — ACETAMINOPHEN 325 MG PO TABS
650.0000 mg | ORAL_TABLET | ORAL | Status: DC | PRN
  Administered 2019-04-21: 650 mg via ORAL
  Filled 2019-04-21: qty 2

## 2019-04-21 MED ORDER — ONDANSETRON HCL 4 MG PO TABS: 4.0000 mg | ORAL_TABLET | ORAL | Status: DC | PRN

## 2019-04-21 MED ORDER — FLEET ENEMA 7-19 GM/118ML RE ENEM: 1.0000 | ENEMA | RECTAL | Status: DC | PRN

## 2019-04-21 MED ORDER — IBUPROFEN 600 MG PO TABS
600.0000 mg | ORAL_TABLET | Freq: Four times a day (QID) | ORAL | Status: DC
  Administered 2019-04-21 – 2019-04-23 (×6): 600 mg via ORAL
  Filled 2019-04-21 (×6): qty 1

## 2019-04-21 MED ORDER — DIPHENHYDRAMINE HCL 50 MG/ML IJ SOLN: 12.5000 mg | INTRAMUSCULAR | Status: DC | PRN

## 2019-04-21 MED ORDER — FENTANYL-BUPIVACAINE-NACL 0.5-0.125-0.9 MG/250ML-% EP SOLN
12.0000 mL/h | EPIDURAL | Status: DC | PRN
  Filled 2019-04-21: qty 250

## 2019-04-21 MED ORDER — TETANUS-DIPHTH-ACELL PERTUSSIS 5-2.5-18.5 LF-MCG/0.5 IM SUSP: 0.5000 mL | Freq: Once | INTRAMUSCULAR | Status: DC

## 2019-04-21 MED ORDER — ZOLPIDEM TARTRATE 5 MG PO TABS: 5.0000 mg | ORAL_TABLET | Freq: Every evening | ORAL | Status: DC | PRN

## 2019-04-21 MED ORDER — DIPHENHYDRAMINE HCL 25 MG PO CAPS: 25.0000 mg | ORAL_CAPSULE | Freq: Four times a day (QID) | ORAL | Status: DC | PRN

## 2019-04-21 MED ORDER — PRENATAL MULTIVITAMIN CH
1.0000 | ORAL_TABLET | Freq: Every day | ORAL | Status: DC
  Administered 2019-04-22: 1 via ORAL
  Filled 2019-04-21: qty 1

## 2019-04-21 MED ORDER — OXYTOCIN BOLUS FROM INFUSION: 500.0000 mL | Freq: Once | INTRAVENOUS | Status: DC

## 2019-04-21 MED ORDER — SIMETHICONE 80 MG PO CHEW: 80.0000 mg | CHEWABLE_TABLET | ORAL | Status: DC | PRN

## 2019-04-21 MED ORDER — COCONUT OIL OIL: 1.0000 "application " | TOPICAL_OIL | Status: DC | PRN

## 2019-04-21 MED ORDER — OXYCODONE-ACETAMINOPHEN 5-325 MG PO TABS: 1.0000 | ORAL_TABLET | ORAL | Status: DC | PRN

## 2019-04-21 MED ORDER — ACETAMINOPHEN 325 MG PO TABS
650.0000 mg | ORAL_TABLET | ORAL | Status: DC | PRN
  Filled 2019-04-21: qty 2

## 2019-04-21 MED ORDER — EPHEDRINE 5 MG/ML INJ: 10.0000 mg | INTRAVENOUS | Status: DC | PRN

## 2019-04-21 MED ORDER — OXYCODONE-ACETAMINOPHEN 5-325 MG PO TABS: 2.0000 | ORAL_TABLET | ORAL | Status: DC | PRN

## 2019-04-21 MED ORDER — OXYTOCIN 40 UNITS IN NORMAL SALINE INFUSION - SIMPLE MED
1.0000 m[IU]/min | INTRAVENOUS | Status: DC
  Administered 2019-04-21: 14:00:00 2 m[IU]/min via INTRAVENOUS
  Filled 2019-04-21: qty 1000

## 2019-04-21 MED ORDER — LIDOCAINE HCL (PF) 1 % IJ SOLN
INTRAMUSCULAR | Status: DC | PRN
  Administered 2019-04-21 (×2): 5 mL via EPIDURAL

## 2019-04-21 MED ORDER — PHENYLEPHRINE 40 MCG/ML (10ML) SYRINGE FOR IV PUSH (FOR BLOOD PRESSURE SUPPORT)
80.0000 ug | PREFILLED_SYRINGE | INTRAVENOUS | Status: DC | PRN
  Filled 2019-04-21: qty 10

## 2019-04-21 MED ORDER — WITCH HAZEL-GLYCERIN EX PADS: 1.0000 "application " | MEDICATED_PAD | CUTANEOUS | Status: DC | PRN

## 2019-04-21 MED ORDER — TERBUTALINE SULFATE 1 MG/ML IJ SOLN: 0.2500 mg | Freq: Once | INTRAMUSCULAR | Status: DC | PRN

## 2019-04-21 MED ORDER — DIBUCAINE (PERIANAL) 1 % EX OINT: 1.0000 "application " | TOPICAL_OINTMENT | CUTANEOUS | Status: DC | PRN

## 2019-04-21 NOTE — MAU Note (Signed)
CTX 2 mins apart.  No LOF/VB. + FM.  GBS -

## 2019-04-21 NOTE — Discharge Summary (Addendum)
Postpartum Discharge Summary  Patient Name: Brooke Mclaughlin DOB: [birth year] MRN: [id number]  Date of admission: 04/21/2019 Delivering Provider: Lajean Manes   Date of discharge: 04/23/2019  Admitting diagnosis: 55.4WKS CTX Intrauterine pregnancy: [redacted]w[redacted]d    Secondary diagnosis:  Active Problems:   Normal labor   SVD (spontaneous vaginal delivery)  Additional problems: obesity affecting pregnancy, vitamin D deficiency      Discharge diagnosis: Term Pregnancy Delivered                                                                                                Post partum procedures:none  Augmentation: AROM and Pitocin  Complications: None  Hospital course:  Onset of Labor With Vaginal Delivery     33y.o. yo G2P1001 at 386w4das admitted in Latent Labor on 04/21/2019. Patient had an uncomplicated labor course as follows:  Membrane Rupture Time/Date: 9:21 AM ,04/21/2019   Intrapartum Procedures: Episiotomy: None [1]                                         Lacerations:  2nd degree [3]  Patient had a delivery of a Viable infant. 04/21/2019  Information for the patient's newborn:  [place], [place] [[id number]]Delivery Method: Vaginal, Spontaneous(Filed from Delivery Summary)     Pateint had an uncomplicated postpartum course.  She is ambulating, tolerating a regular diet, passing flatus, and urinating well. Patient is discharged home in stable condition on 04/23/19.  Delivery time: 3:57 PM    Magnesium Sulfate received: No BMZ received: No Rhophylac:N/A MMR:No Transfusion:No  Physical exam  Vitals:   04/22/19 0855 04/22/19 1256 04/22/19 2130 04/23/19 0545  BP: 124/68 117/82 112/70 (!) 112/92  Pulse: 85 75 80   Resp:  '18 18 18  '$ Temp: 98.4 F (36.9 C) 98.6 F (37 C) 97.9 F (36.6 C) 98.1 F (36.7 C)  TempSrc: Oral Oral Oral Oral  SpO2: 99%  100% 97%  Weight:      Height:       General: alert, cooperative and no distress  Cardiac: RRR, no rubs or gallops, no  cap refill  Pulm: lung clear on ausc, no crackles or wheeze, no resp distress GI: Abdo soft, non tender, bowel sounds present  Lochia: appropriate Uterine Fundus: firm Incision: N/A DVT Evaluation: No evidence of DVT seen on physical exam. No cords or calf tenderness. No significant calf/ankle edema. Labs: Lab Results  Component Value Date   WBC 13.4 (H) 04/21/2019   HGB 12.3 04/21/2019   HCT 37.1 04/21/2019   MCV 92.5 04/21/2019   PLT 203 04/21/2019   CMP Latest Ref Rng & Units 09/26/2018  Glucose 65 - 99 mg/dL 86  BUN 6 - 20 mg/dL 8  Creatinine 0.57 - 1.00 mg/dL 0.68  Sodium 134 - 144 mmol/L 139  Potassium 3.5 - 5.2 mmol/L 4.3  Chloride 96 - 106 mmol/L 104  CO2 20 - 29 mmol/L 20  Calcium 8.7 - 10.2 mg/dL 9.6  Total Protein  6.0 - 8.5 g/dL 7.0  Total Bilirubin 0.0 - 1.2 mg/dL 0.3  Alkaline Phos 39 - 117 IU/L 59  AST 0 - 40 IU/L 20  ALT 0 - 32 IU/L 28    Discharge instruction: per After Visit Summary and "Baby and Me Booklet".  After visit meds:  Allergies as of 04/23/2019   No Known Allergies     Medication List    TAKE these medications   acetaminophen 325 MG tablet Commonly known as: Tylenol Take 2 tablets (650 mg total) by mouth every 4 (four) hours as needed (for pain scale < 4).   aspirin EC 81 MG tablet Take 1 tablet (81 mg total) by mouth daily. Take after 12 weeks for prevention of preeclampsia later in pregnancy   fexofenadine 60 MG tablet Commonly known as: ALLEGRA Take 60 mg by mouth 2 (two) times daily.   ibuprofen 600 MG tablet Commonly known as: ADVIL Take 1 tablet (600 mg total) by mouth every 6 (six) hours.   loratadine 10 MG tablet Commonly known as: CLARITIN Take 10 mg by mouth daily as needed for allergies. Alternates between Allegra and Claritin.   multivitamin-prenatal 27-0.8 MG Tabs tablet Take 1 tablet by mouth daily at 12 noon.   triamcinolone ointment 0.1 % Commonly known as: KENALOG Apply 1 application topically 2 (two) times  daily. What changed:   when to take this  reasons to take this       Diet: routine diet  Activity: Advance as tolerated. Pelvic rest for 6 weeks.   Outpatient follow up:4 weeks Follow up Appt: Future Appointments  Date Time Provider [place]  05/19/2019  3:30 PM Anyanwu, Sallyanne Havers, MD [facility]  10/09/2019  8:15 AM Danford, Berna Spare, NP [facility] None   Follow up Visit:  Please schedule this patient for Postpartum visit in: 4 weeks with the following provider: Any provider For C/S patients schedule nurse incision check in weeks 2 weeks: no Low risk pregnancy complicated by: nothing Delivery mode:  SVD Anticipated Birth Control:  other/unsure PP Procedures needed: postpartum pap smear  Schedule Integrated BH visit: no   Newborn Data: Live born female  Birth Weight:  3600g APGAR: 9, 9  Newborn Delivery   Birth date/time: 04/21/2019 15:57:00 Delivery type: Vaginal, Spontaneous      Baby Feeding: Breast Disposition:home with mother   04/23/2019 Lattie Haw, MD PGY-1, [facility]   I saw and evaluated the patient. I agree with the findings and the plan of care as documented in the resident's note. Mom will decide about contraception at 6 week visit. Discussed not taking OCP's for first 6 weeks post-partum. Feeling well and would like to DC. Vitals stable. Lochia appropriate. Breastfeeding.   Barrington Ellison, MD [facility] Family Medicine Fellow, [facility] for [employer], [place]

## 2019-04-21 NOTE — Progress Notes (Signed)
Labor Progress Note Brooke Mclaughlin is a 33 y.o. G2P1001 at [redacted]w[redacted]d presented for SOL. S: Feeling comfortable with Epidural in place.  O:  BP 126/68   Pulse 93   Temp 97.8 F (36.6 C) (Oral)   Resp 17   Wt 107.3 kg   LMP 07/18/2018 (Exact Date)   SpO2 100%   BMI 40.29 kg/m  EFM: 120, moderate variability, reactive, pos accels, no decels   CVE: Dilation: 4.5 Effacement (%): 70 Station: -2 Presentation: Vertex Exam by:: MD Hien Perreira   A&P: 33 y.o. G2P1001 [redacted]w[redacted]d here for SOL.  #Labor: Progressing well. No change with this SVE. Could consider Pit or AROM at next check if no change. AROM deferred for when head is better applied.  #Pain: Epidural in place #FWB: Cat I #GBS negative  Chauncey Mann, MD 5:10 AM

## 2019-04-21 NOTE — Lactation Note (Signed)
This note was copied from a baby's chart. Lactation Consultation Note  Patient Name: Brooke Mclaughlin ZWCHE'N Date: 04/21/2019 Reason for consult: Initial assessment    Infant in blankets and cueing.  Mom states he is feeding well.  She is exp. Breastfeeding mom.  Bf 6 months with her 34 yr old.  In chart it is stated infant is 5 lb 11 oz.  On grease board, and delivery summary, infants weight is 7lb  15oz.  LC reviewed hand exp.  Mom states she has been leaking breastmilk for weeks now.    She hand expresses then placed the infant in cross cradle.  Infant latches with not the widest gape, feeds actively for a few minutes then comes off and cues again.  LC encouraged mom to wait until gape is at widest point and bring infant to breast.  Mom did this and deep latch was achieved.  Mom denied any discomfort.  Rhythmic jaw movement noted and swallows heard.  LC encouraged mom to use compression and massage during feed to keep infant awake and active at breast.  BF basics reviewed with mom, feeding 8-12 times in 24 hours, hand exp. STS, and waking techniques.  Infant still nursing when [facility] left room.  Brochure with lactation services reviewed and given to patient.  Support groups encouraged and phone number shared.   Maternal Data Has patient been taught Hand Expression?: Yes Does the patient have breastfeeding experience prior to this delivery?: Yes  Feeding Feeding Type: Breast Fed  LATCH Score Latch: Grasps breast easily, tongue down, lips flanged, rhythmical sucking.  Audible Swallowing: A few with stimulation  Type of Nipple: Everted at rest and after stimulation  Comfort (Breast/Nipple): Soft / non-tender  Hold (Positioning): Assistance needed to correctly position infant at breast and maintain latch.  LATCH Score: 8  Interventions Interventions: Breast feeding basics reviewed;Assisted with latch;Skin to skin;Adjust position;Position options;Support pillows;Breast  compression;Hand express  Lactation Tools Discussed/Used WIC Program: No   Consult Status Consult Status: Follow-up Date: 04/22/19 Follow-up type: In-patient    Ferne Coe [facility] 04/21/2019, 8:21 PM

## 2019-04-21 NOTE — Progress Notes (Signed)
LABOR PROGRESS NOTE  Matsuko SWEET JARVIS is a 33 y.o. G2P1001 at [redacted]w[redacted]d  admitted for SOL.  Subjective: She continues to be comfortable with her epidural. Laying on left side, not feeling pressure in her bottom.  Objective: BP 124/73   Pulse 79   Temp 98.1 F (36.7 C) (Axillary)   Resp 16   Ht 5\' 4"  (1.626 m)   Wt 107.3 kg   LMP 07/18/2018 (Exact Date)   SpO2 100%   BMI 40.61 kg/m  or  Vitals:   04/21/19 1200 04/21/19 1230 04/21/19 1300 04/21/19 1323  BP: 115/74 128/75 124/73   Pulse: 85 79 79   Resp: 20 18 16    Temp:    98.1 F (36.7 C)  TempSrc:    Axillary  SpO2:      Weight:      Height:        Dilation: 5 Effacement (%): 90 Station: -1 Presentation: Vertex Exam by:: Cecelia Byars, RN FHT: baseline rate 125, moderate varibility, good acel, some early and variable decel, cat II strip Toco: 2-3 min  Labs: Lab Results  Component Value Date   WBC 13.4 (H) 04/21/2019   HGB 12.3 04/21/2019   HCT 37.1 04/21/2019   MCV 92.5 04/21/2019   PLT 203 04/21/2019    Patient Active Problem List   Diagnosis Date Noted  . Normal labor 04/21/2019  . Obesity (BMI 35.0-39.9 without comorbidity) 10/10/2018  . Supervision of other normal pregnancy, antepartum 09/09/2018  . Obesity affecting pregnancy 09/09/2018  . Vitamin D deficiency 10/01/2017  . Healthcare maintenance 09/10/2017  . Migraines 09/10/2017    Assessment / Plan: 33 y.o. G2P1001 at [redacted]w[redacted]d here for SOL.  Labor: Start pitocin at 2 mU/min and titrate up every 30 min Fetal Wellbeing:  Cat II tracing, reassuring with no indications for intervention Pain Control:  Epidural  Anticipated MOD:  SVD  Vanessa Kick [facility] Medical Student 04/21/2019, 1:33 PM

## 2019-04-21 NOTE — Anesthesia Preprocedure Evaluation (Signed)
Anesthesia Evaluation  Patient identified by MRN, date of birth, ID band Patient awake    Reviewed: Allergy & Precautions, H&P , NPO status , Patient's Chart, lab work & pertinent test results  History of Anesthesia Complications Negative for: history of anesthetic complications  Airway Mallampati: II  TM Distance: >3 FB Neck ROM: full    Dental no notable dental hx. (+) Teeth Intact   Pulmonary neg pulmonary ROS,    Pulmonary exam normal breath sounds clear to auscultation       Cardiovascular negative cardio ROS Normal cardiovascular exam Rhythm:regular Rate:Normal     Neuro/Psych negative neurological ROS  negative psych ROS   GI/Hepatic negative GI ROS, Neg liver ROS,   Endo/Other  Morbid obesity  Renal/GU negative Renal ROS  negative genitourinary   Musculoskeletal   Abdominal   Peds  Hematology negative hematology ROS (+)   Anesthesia Other Findings   Reproductive/Obstetrics (+) Pregnancy                             Anesthesia Physical Anesthesia Plan  ASA: III  Anesthesia Plan: Epidural   Post-op Pain Management:    Induction:   PONV Risk Score and Plan:   Airway Management Planned:   Additional Equipment:   Intra-op Plan:   Post-operative Plan:   Informed Consent: I have reviewed the patients History and Physical, chart, labs and discussed the procedure including the risks, benefits and alternatives for the proposed anesthesia with the patient or authorized representative who has indicated his/her understanding and acceptance.       Plan Discussed with:   Anesthesia Plan Comments:         Anesthesia Quick Evaluation  

## 2019-04-21 NOTE — Plan of Care (Signed)
  Problem: Education: Goal: Knowledge of General Education information will improve Description: Including pain rating scale, medication(s)/side effects and non-pharmacologic comfort measures Outcome: Progressing   Problem: Health Behavior/Discharge Planning: Goal: Ability to manage health-related needs will improve Outcome: Progressing   Problem: Clinical Measurements: Goal: Ability to maintain clinical measurements within normal limits will improve Outcome: Progressing Goal: Will remain free from infection Outcome: Progressing Goal: Cardiovascular complication will be avoided Outcome: Progressing   Problem: Nutrition: Goal: Adequate nutrition will be maintained Outcome: Progressing   Problem: Education: Goal: Ability to state and carry out methods to decrease the pain will improve Outcome: Progressing   Problem: Coping: Goal: Ability to verbalize concerns and feelings about labor and delivery will improve Outcome: Progressing   Problem: Safety: Goal: Risk of complications during labor and delivery will decrease Outcome: Progressing   Problem: Pain Management: Goal: Relief or control of pain from uterine contractions will improve Outcome: Progressing

## 2019-04-21 NOTE — Progress Notes (Signed)
LABOR PROGRESS NOTE  Brooke Mclaughlin is a 33 y.o. G2P1001 at [redacted]w[redacted]d  admitted for SOL  Subjective: Patient comfortable with epidural, no feeling any pressure in bottom   Objective: BP 106/73   Pulse 91   Temp 97.9 F (36.6 C) (Oral)   Resp 18   Ht 5\' 4"  (1.626 m)   Wt 107.3 kg   LMP 07/18/2018 (Exact Date)   SpO2 100%   BMI 40.61 kg/m  or  Vitals:   04/21/19 0800 04/21/19 0830 04/21/19 0900 04/21/19 0930  BP: (!) 114/56 (!) 106/56 (!) 106/47 106/73  Pulse: 85 86 93 91  Resp: 20 18 20 18   Temp:    97.9 F (36.6 C)  TempSrc:    Oral  SpO2:      Weight:      Height:        AROM @0921 - bloody/clear, scant amount of fluid  Dilation: 4.5 Effacement (%): 70 Station: -2 Presentation: Vertex Exam by:: Stann Mainland, CNM FHT: baseline rate 115, moderate varibility, +accel, no decel Toco: 2-5  Labs: Lab Results  Component Value Date   WBC 13.4 (H) 04/21/2019   HGB 12.3 04/21/2019   HCT 37.1 04/21/2019   MCV 92.5 04/21/2019   PLT 203 04/21/2019    Patient Active Problem List   Diagnosis Date Noted  . Normal labor 04/21/2019  . Obesity (BMI 35.0-39.9 without comorbidity) 10/10/2018  . Supervision of other normal pregnancy, antepartum 09/09/2018  . Obesity affecting pregnancy 09/09/2018  . Vitamin D deficiency 10/01/2017  . Healthcare maintenance 09/10/2017  . Migraines 09/10/2017    Assessment / Plan: 33 y.o. G2P1001 at [redacted]w[redacted]d here for SOL  Labor: AROM @0921 , plan to recheck patient in 2-3 hours to assess for pitocin Fetal Wellbeing:  Cat I  Pain Control:  Epidural  Anticipated MOD:  SVD  Lajean Manes, CNM 04/21/2019, 10:05 AM

## 2019-04-21 NOTE — Anesthesia Procedure Notes (Signed)
Epidural Patient location during procedure: OB  Staffing Anesthesiologist: Cyntia Staley, MD Performed: anesthesiologist   Preanesthetic Checklist Completed: patient identified, site marked, surgical consent, pre-op evaluation, timeout performed, IV checked, risks and benefits discussed and monitors and equipment checked  Epidural Patient position: sitting Prep: DuraPrep Patient monitoring: heart rate, continuous pulse ox and blood pressure Approach: midline Location: L3-L4 Injection technique: LOR saline  Needle:  Needle type: Tuohy  Needle gauge: 17 G Needle length: 9 cm and 9 Needle insertion depth: 7 cm Catheter type: closed end flexible Catheter size: 20 Guage Catheter at skin depth: 11 cm Test dose: negative  Assessment Events: blood not aspirated, injection not painful, no injection resistance, negative IV test and no paresthesia  Additional Notes Patient identified. Risks/Benefits/Options discussed with patient including but not limited to bleeding, infection, nerve damage, paralysis, failed block, incomplete pain control, headache, blood pressure changes, nausea, vomiting, reactions to medication both or allergic, itching and postpartum back pain. Confirmed with bedside nurse the patient's most recent platelet count. Confirmed with patient that they are not currently taking any anticoagulation, have any bleeding history or any family history of bleeding disorders. Patient expressed understanding and wished to proceed. All questions were answered. Sterile technique was used throughout the entire procedure. Please see nursing notes for vital signs. Test dose was given through epidural needle and negative prior to continuing to dose epidural or start infusion. Warning signs of high block given to the patient including shortness of breath, tingling/numbness in hands, complete motor block, or any concerning symptoms with instructions to call for help. Patient was given  instructions on fall risk and not to get out of bed. All questions and concerns addressed with instructions to call with any issues.     

## 2019-04-21 NOTE — H&P (Signed)
OBSTETRIC ADMISSION HISTORY AND PHYSICAL  Brooke Mclaughlin is a 33 y.o. female G2P1001 with IUP at [redacted]w[redacted]d by LMP and 10 wk [place] in office presenting for SOL. She reports contractions all day but worsening this afternoon and specifically after 2100. Reports +FMs, No LOF, no VB, no blurry vision, headaches or peripheral edema, and RUQ pain.  She plans on breast feeding. She is undecided for birth control. She received her prenatal care at [facility]   Dating: By LMP and 10 wk [place] in office --->  Estimated Date of Delivery: 04/24/19  Sono:  5/20  @[redacted]w[redacted]d , CWD, normal anatomy, breech presentation, posterior placenta lie, 767g, 57% EFW  Prenatal History/Complications: Obesity for which patient was started on Aspirin 81 mg - First baby 3664 g without complication  Past Medical History: Past Medical History:  Diagnosis Date  . Eczema   . Frequent headaches   . History of cold sores    No genital lesions  . History of Papanicolaou smear of cervix 12/31/13; 05/01/16   NEG; -/-  . Irregular menses   . Migraines   . Palpitations   . Seasonal allergies 10/01/2017  . Syncope 2003   has felt that way since but did not faint  . Urticaria   . UTI (lower urinary tract infection)     Past Surgical History: Past Surgical History:  Procedure Laterality Date  . ADENOIDECTOMY    . TONSILECTOMY, ADENOIDECTOMY, BILATERAL MYRINGOTOMY AND TUBES Bilateral   . TYMPANOSTOMY TUBE PLACEMENT      Obstetrical History: OB History    Gravida  2   Para  1   Term  1   Preterm      AB      Living  1     SAB      TAB      Ectopic      Multiple      Live Births  1           Social History: Social History   Socioeconomic History  . Marital status: Single    Spouse name: Not on file  . Number of children: 1  . Years of education: 38  . Highest education level: Not on file  Occupational History  . 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 Financial resource strain: Not  hard at all  . Food insecurity    Worry: Never true    Inability: Never true  . Transportation needs    Medical: No    Non-medical: No  Tobacco Use  . Smoking status: Never Smoker  . Smokeless tobacco: Never Used  Substance and Sexual Activity  . Alcohol use: Not Currently    Alcohol/week: 6.0 standard drinks    Types: 6 Cans of beer per week  . Drug use: No  . Sexual activity: Yes  Lifestyle  . Physical activity    Days per week: Not on file    Minutes per session: Not on file  . Stress: Not on file  Relationships  . Social Herbalist on phone: Not on file    Gets together: Not on file    Attends religious service: Not on file    Active member of club or organization: Not on file    Attends meetings of clubs or organizations: Not on file    Relationship status: Not on file  Other Topics Concern  . Not on file  Social History Narrative   Lives  at home with boyfriend, son, and boyfriend's son   Right handed   Drinks </= 1 cup caffeine daily    Family History: Family History  Problem Relation Age of Onset  . Hypertension Mother        RUNS ON MAT SIDE OF FAMILY  . Migraines Father   . Hypertension Maternal Grandmother   . Heart disease Maternal Grandmother   . Heart attack Maternal Grandmother 28  . Heart disease Maternal Aunt   . Heart attack Maternal Aunt   . Hypertension Maternal Aunt   . Heart disease Maternal Uncle   . Heart attack Maternal Uncle   . Diabetes Maternal Uncle   . Hypertension Maternal Uncle     Allergies: No Known Allergies  Medications Prior to Admission  Medication Sig Dispense Refill Last Dose  . aspirin EC 81 MG tablet Take 1 tablet (81 mg total) by mouth daily. Take after 12 weeks for prevention of preeclampsia later in pregnancy (Patient not taking: Reported on 04/16/2019) 300 tablet 2   . fexofenadine (ALLEGRA) 60 MG tablet Take 60 mg by mouth 2 (two) times daily.     Marland Kitchen loratadine (CLARITIN) 10 MG tablet Take 10 mg by  mouth daily.     . Prenatal Vit-Fe Fumarate-FA (MULTIVITAMIN-PRENATAL) 27-0.8 MG TABS tablet Take 1 tablet by mouth daily at 12 noon.     . triamcinolone ointment (KENALOG) 0.1 % Apply 1 application topically 2 (two) times daily. 80 g 3      Review of Systems   All systems reviewed and negative except as stated in HPI  Blood pressure 131/75, pulse 69, temperature 97.8 F (36.6 C), temperature source Oral, resp. rate 17, weight 107.3 kg, last menstrual period 07/18/2018, SpO2 100 %. General appearance: alert, cooperative, appears stated age and no distress Lungs: normal effort Heart: regular rate  Abdomen: soft, non-tender; bowel sounds normal Pelvic: gravid uterus  Extremities: Homans sign is negative, no sign of DVT Presentation: cephalic Fetal monitoringBaseline: 120 bpm, Variability: Good {> 6 bpm), Accelerations: Reactive and Decelerations: Absent Uterine activityFrequency: Every 2-5 minutes Dilation: 4.5 Effacement (%): 50 Station: -2 Exam by:: CJ Williams,RN   Prenatal labs: ABO, Rh: --/--/A POS (08/31 0300) Antibody: NEG (08/31 0300) Rubella: 2.86 (02/06 0950) RPR: Non Reactive (06/11 0825)  HBsAg: Negative (02/06 0950)  HIV: Non Reactive (06/11 0825)  GBS:  neg 2 hr Glucola WNL Genetic screening normal Anatomy US WNL as above   Prenatal Transfer Tool  Maternal Diabetes: No Genetic Screening: Normal Maternal Ultrasounds/Referrals: Normal Fetal Ultrasounds or other Referrals:  None Maternal Substance Abuse:  No Significant Maternal Medications:  Aspirin only  Significant Maternal Lab Results: None  Results for orders placed or performed during the hospital encounter of 04/21/19 (from the past 24 hour(s))  CBC   Collection Time: 04/21/19  3:00 AM  Result Value Ref Range   WBC 13.4 (H) 4.0 - 10.5 K/uL   RBC 4.01 3.87 - 5.11 MIL/uL   Hemoglobin 12.3 12.0 - 15.0 g/dL   HCT 99.3 71.6 - 96.7 %   MCV 92.5 80.0 - 100.0 fL   MCH 30.7 26.0 - 34.0 pg   MCHC 33.2  30.0 - 36.0 g/dL   RDW 89.3 81.0 - 17.5 %   Platelets 203 150 - 400 K/uL   nRBC 0.0 0.0 - 0.2 %  Type and screen [facility]   Collection Time: 04/21/19  3:00 AM  Result Value Ref Range   ABO/RH(D) A POS  Antibody Screen NEG    Sample Expiration      04/24/2019,2359 Performed at [facility], [address] [place], [place] [address]     Patient Active Problem List   Diagnosis Date Noted  . Obesity (BMI 35.0-39.9 without comorbidity) 10/10/2018  . Supervision of other normal pregnancy, antepartum 09/09/2018  . Obesity affecting pregnancy 09/09/2018  . Vitamin D deficiency 10/01/2017  . Healthcare maintenance 09/10/2017  . Migraines 09/10/2017    Assessment/Plan:  Earley Abidemy T Iturralde is a 33 y.o. G2P1001 at 2425w4d here for SOL.   #Labor: Patient with SVE 1/thick/-3 on 8/26 and presented to MAU with contractions. SVE from 3.5-4.5 in one hour. Anticipate vaginal delivery. Expectant management at this time.  #Pain: Epidural desired at this time #FWB: Cat I; EFW: 3700g #ID:  GBS neg; COVID pending  #MOF: breast #MOC: undecided   Jerilynn Birkenheadhelsea Daritza Brees, MD [facility] Fellow, [facility], [facility] 04/21/2019, 4:17 AM

## 2019-04-22 NOTE — Anesthesia Postprocedure Evaluation (Signed)
Anesthesia Post Note  Patient: Brooke Mclaughlin  Procedure(s) Performed: AN AD HOC LABOR EPIDURAL     Patient location during evaluation: Mother Baby Anesthesia Type: Epidural Level of consciousness: awake and alert Pain management: pain level controlled Vital Signs Assessment: post-procedure vital signs reviewed and stable Respiratory status: spontaneous breathing, nonlabored ventilation and respiratory function stable Cardiovascular status: stable Postop Assessment: no headache, no backache, epidural receding, no apparent nausea or vomiting, patient able to bend at knees, adequate PO intake and able to ambulate Anesthetic complications: no    Last Vitals:  Vitals:   04/21/19 2344 04/22/19 0500  BP: 114/68 119/76  Pulse: 89 84  Resp: 18 18  Temp: 36.8 C 36.7 C  SpO2: 98% 99%    Last Pain:  Vitals:   04/22/19 0500  TempSrc: Oral  PainSc: 2    Pain Goal:                   [employer]

## 2019-04-22 NOTE — Lactation Note (Signed)
This note was copied from a baby's chart. Lactation Consultation Note  Patient Name: Brooke Mclaughlin Today's Date: 04/22/2019   The nurse tech had informed me that Mom wanted to see me (see below). When I arrived, Mom said that infant had eaten recently and latched fine. Mom declines any lactation assist for the remainder of today.   Mom had been worried b/c infant did not eat from 2000 last evening until 0730 this morning, citing that infant was spitty/gaggy or would fall asleep (there were a couple of attempts charted during that time). I suggested that if such a long duration were to happen between feedings again, Mom could do hand expression and ask for assist with spoon or finger-feeding.   Matthias Hughs [facility] 04/22/2019, 8:01 AM

## 2019-04-22 NOTE — Progress Notes (Signed)
POSTPARTUM PROGRESS NOTE  Post Partum Day 1  Subjective:  Brooke Mclaughlin is a 33 y.o. G2R4270 s/p SVD at [redacted]w[redacted]d.  She reports she is doing well. No acute events overnight. She denies any problems with ambulating, voiding or po intake. Denies nausea or vomiting.  Pain is well controlled - has some pelvic cramping.  Lochia is appropriate.  Objective: Blood pressure 119/76, pulse 84, temperature 98.1 F (36.7 C), temperature source Oral, resp. rate 18, height 5\' 4"  (1.626 m), weight 107.3 kg, last menstrual period 07/18/2018, SpO2 99 %, unknown if currently breastfeeding.  Physical Exam:  General: alert, cooperative and no distress Chest: no respiratory distress. Heart:regular rate, distal pulses intact Abdomen: soft, nontender,  Uterine Fundus: firm, appropriately tender DVT Evaluation: No calf swelling or tenderness Extremities: no edema Skin: warm, dry  Recent Labs    04/21/19 0300  HGB 12.3  HCT 37.1    Assessment/Plan: Brooke Mclaughlin is a 33 y.o. W2B7628 s/p SVD at [redacted]w[redacted]d   PPD#1 - Doing well  Routine postpartum care Contraception: undecided Feeding: breast Dispo: Plan for discharge 9/2.   LOS: 1 day   Jay Student 04/22/2019, 7:15 AM

## 2019-04-23 ENCOUNTER — Encounter: Payer: BC Managed Care – PPO | Admitting: Advanced Practice Midwife

## 2019-04-23 ENCOUNTER — Encounter: Payer: Self-pay | Admitting: Certified Nurse Midwife

## 2019-04-23 DIAGNOSIS — O411290 Chorioamnionitis, unspecified trimester, not applicable or unspecified: Secondary | ICD-10-CM | POA: Insufficient documentation

## 2019-04-23 MED ORDER — IBUPROFEN 600 MG PO TABS: 600.0000 mg | ORAL_TABLET | Freq: Four times a day (QID) | ORAL | 0 refills | Status: DC

## 2019-04-23 MED ORDER — ACETAMINOPHEN 325 MG PO TABS: 650.0000 mg | ORAL_TABLET | ORAL | Status: AC | PRN

## 2019-04-23 NOTE — Lactation Note (Signed)
This note was copied from a baby's chart. Lactation Consultation Note  Patient Name: Brooke Mclaughlin [id number] Date: 04/23/2019 Reason for consult: Follow-up assessment;Term Baby is 42 hours old/7% weight loss.  Mom states breastfeeding is going well.  Discussed milk coming to volume and the prevention and treatment of engorgement.  She does have a pump at home.  No questions or concerns.  Reviewed lactation services and encouraged to call prn.  Maternal Data    Feeding Feeding Type: Breast Fed  LATCH Score                   Interventions    Lactation Tools Discussed/Used     Consult Status Consult Status: Complete Follow-up type: Call as needed    Ave Filter 04/23/2019, 10:24 AM

## 2019-04-29 ENCOUNTER — Other Ambulatory Visit (HOSPITAL_COMMUNITY): Payer: BC Managed Care – PPO

## 2019-05-01 ENCOUNTER — Inpatient Hospital Stay (HOSPITAL_COMMUNITY): Payer: BC Managed Care – PPO

## 2019-05-01 ENCOUNTER — Inpatient Hospital Stay (HOSPITAL_COMMUNITY): Admission: AD | Admit: 2019-05-01 | Payer: BC Managed Care – PPO | Source: Home / Self Care | Admitting: Family Medicine

## 2019-05-19 ENCOUNTER — Ambulatory Visit (INDEPENDENT_AMBULATORY_CARE_PROVIDER_SITE_OTHER): Payer: BC Managed Care – PPO | Admitting: Obstetrics & Gynecology

## 2019-05-19 ENCOUNTER — Encounter: Payer: Self-pay | Admitting: Obstetrics & Gynecology

## 2019-05-19 ENCOUNTER — Other Ambulatory Visit: Payer: Self-pay

## 2019-05-19 DIAGNOSIS — Z1151 Encounter for screening for human papillomavirus (HPV): Secondary | ICD-10-CM | POA: Diagnosis not present

## 2019-05-19 DIAGNOSIS — Z124 Encounter for screening for malignant neoplasm of cervix: Secondary | ICD-10-CM

## 2019-05-19 DIAGNOSIS — Z1389 Encounter for screening for other disorder: Secondary | ICD-10-CM

## 2019-05-19 MED ORDER — NORETHINDRONE 0.35 MG PO TABS: 1.0000 | ORAL_TABLET | Freq: Every day | ORAL | 11 refills | Status: DC

## 2019-05-19 NOTE — Progress Notes (Signed)
   Post Partum Exam  Brooke Mclaughlin is a 33 y.o. G64P2002 female who presents for a postpartum visit. She is 4 weeks postpartum following a spontaneous vaginal delivery. I have fully reviewed the prenatal and intrapartum course. The delivery was at  39.4 gestational weeks.  Anesthesia: epidural. Postpartum course has been uncomlicated. Baby's course has been uncomplicated. Baby is feeding by breast. Bleeding staining only. Bowel function is normal. Bladder function is normal. Patient is not sexually active. Contraception method is oral progesterone-only contraceptive. Postpartum depression screening:negative  The following portions of the patient's history were reviewed and updated as appropriate: allergies, current medications, past family history, past medical history, past social history, past surgical history and problem list. Last pap smear done  05/01/2016 and was normal  Review of Systems Pertinent items noted in HPI and remainder of comprehensive ROS otherwise negative.    Objective:  Blood pressure 109/69, pulse 77, weight 217 lb (98.4 kg), last menstrual period 07/18/2018, unknown if currently breastfeeding.  General:  alert and no distress   Breasts:  inspection negative, no nipple discharge or bleeding, no masses or nodularity palpable  Lungs: clear to auscultation bilaterally  Heart:  regular rate and rhythm  Abdomen: soft, non-tender; bowel sounds normal; no masses,  no organomegaly   Vulva:  normal  Vagina: normal vagina, no discharge, exudate, lesion, or erythema  Cervix:  multiparous appearance and no bleeding following Pap  Corpus: normal size, contour, position, consistency, mobility, non-tender  Adnexa:  no mass, fullness, tenderness  Rectal Exam: Not performed.        Assessment:    Normal postpartum exam. Pap smear not done at today's visit.   Plan:   1. Contraception: oral progesterone-only contraceptive pills prescribed. 2. Follow up as needed.    Verita Schneiders, MD, [place] [facility] [employer], [place]

## 2019-05-23 LAB — CYTOLOGY - PAP
Diagnosis: NEGATIVE
High risk HPV: NEGATIVE

## 2019-08-10 IMAGING — US US MFM OB DETAIL +14 WK
1 series · 13 of 28 positions shown · non-contrast
Comparison: none

[Series 1: us mfm ob detail +14 wk · 13 of 97 slices shown]
[im 4/97]
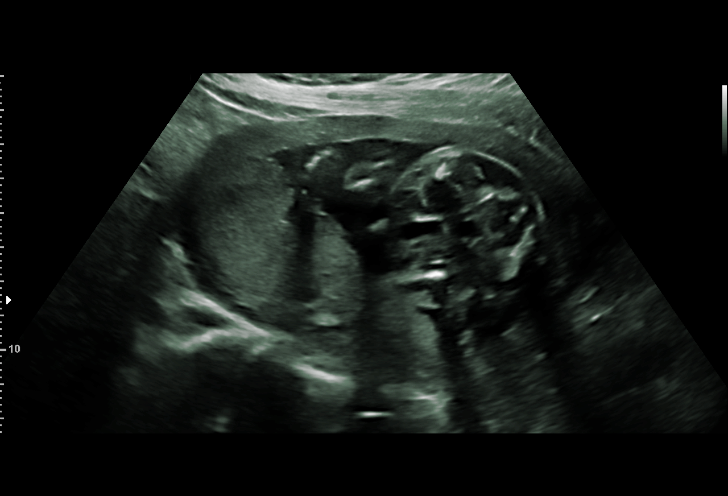
[im 11/97]
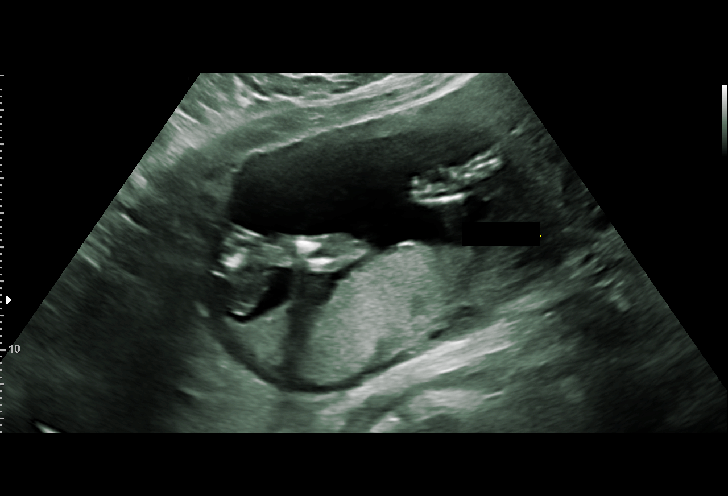
[im 18/97]
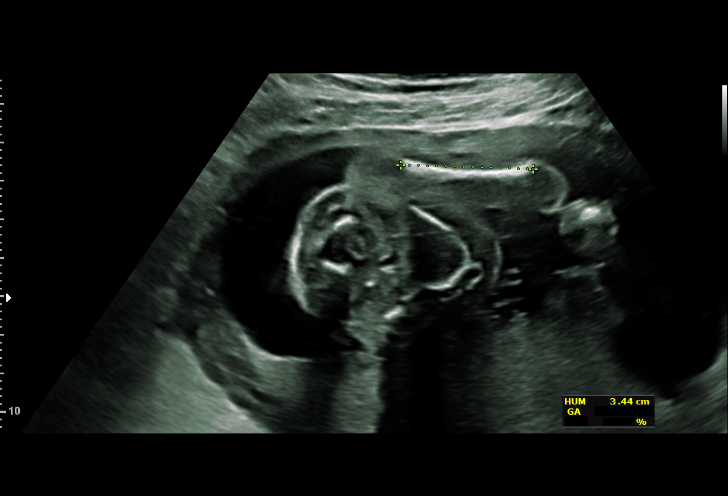
[im 25/97]
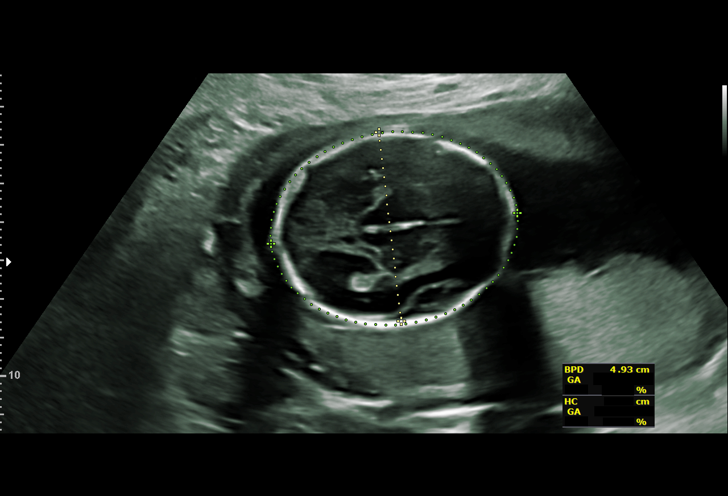
[im 33/97]
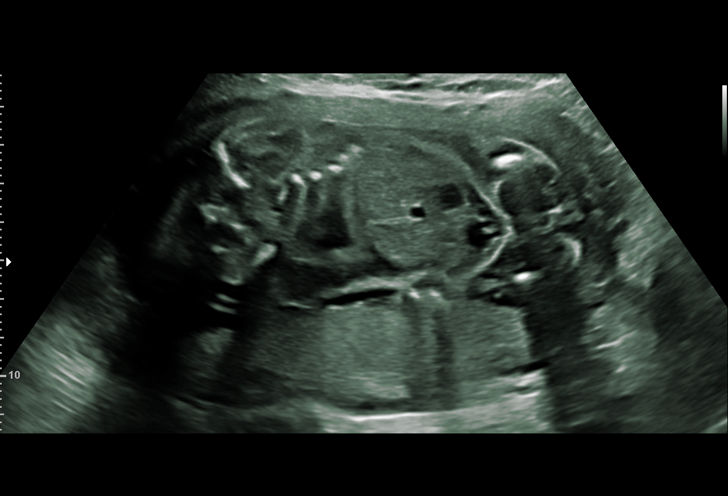
[im 40/97]
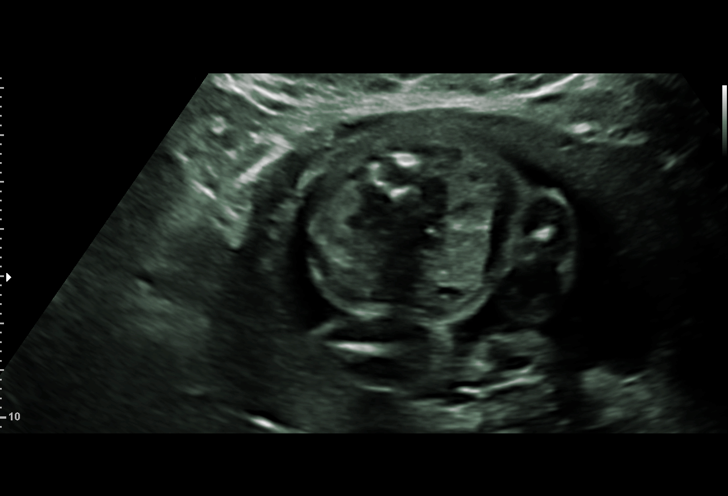
[im 50/97]
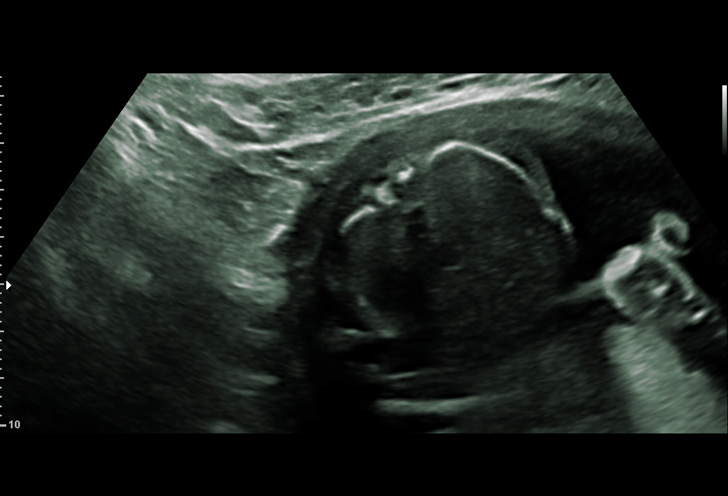
[im 57/97]
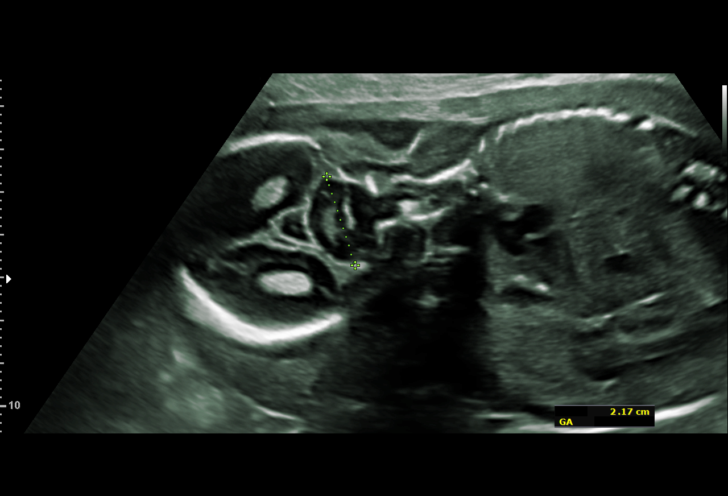
[im 65/97]
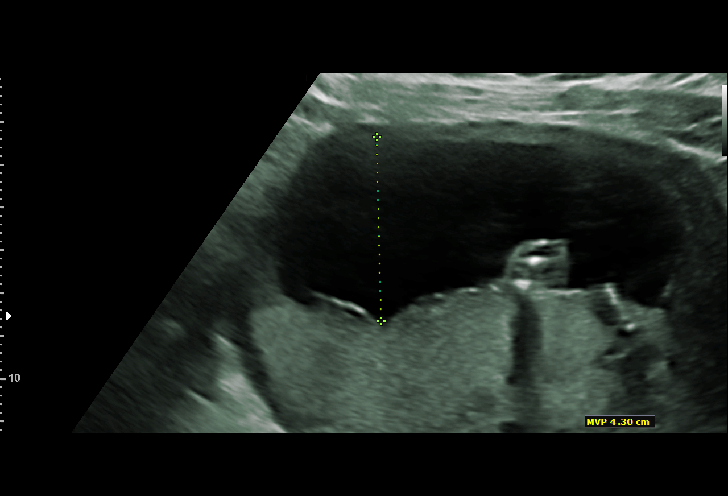
[im 72/97]
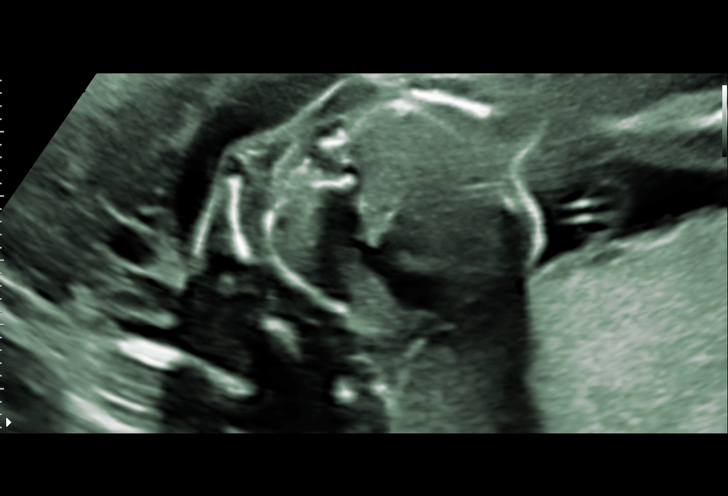
[im 79/97]
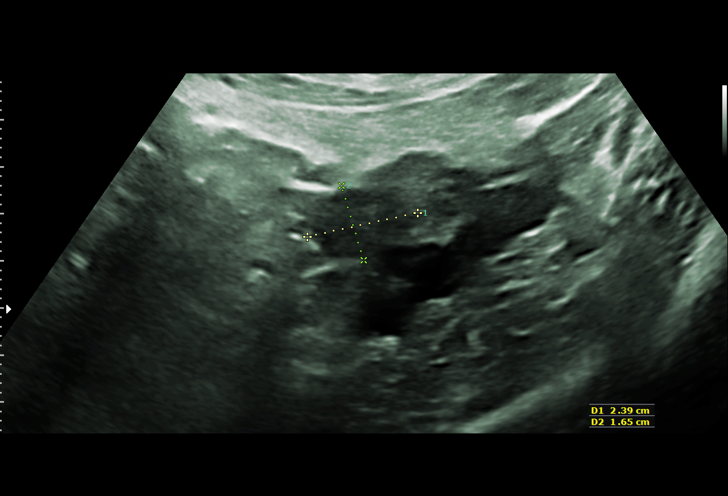
[im 86/97]
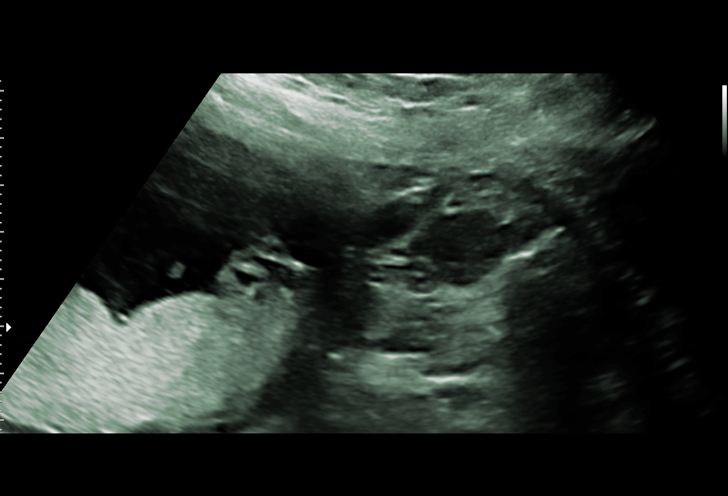
[im 93/97]
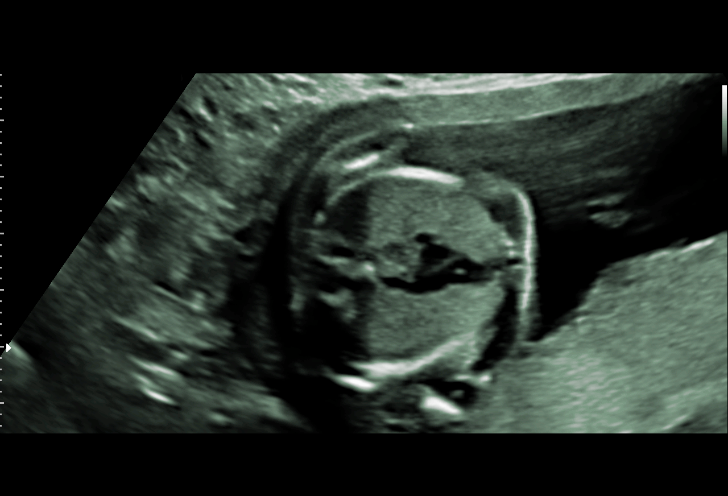

[13 of 28 positions shown; findings below may reference images not displayed]

RDMS@@@123

 ----------------------------------------------------------------------

 ----------------------------------------------------------------------
Indications

  Encounter for antenatal screening for
  malformations
  Negative MSAFP
  Low risk NIPS
  20 weeks gestation of pregnancy
  Obesity complicating pregnancy, second
  trimester
 ----------------------------------------------------------------------
Vital Signs

 BMI:
Fetal Evaluation

 Num Of Fetuses:         1
 Fetal Heart Rate(bpm):  146
 Cardiac Activity:       Observed
 Presentation:           Breech
 Placenta:               Posterior
 P. Cord Insertion:      Not well visualized

 Amniotic Fluid
 AFI FV:      Within normal limits

                             Largest Pocket(cm)

Biometry
 BPD:      50.1  mm     G. Age:  21w 1d         62  %    CI:         74.6   %    70 - 86
                                                         FL/HC:      18.7   %    15.9 -
 HC:      184.1  mm     G. Age:  20w 6d         38  %    HC/AC:      1.15        1.06 -
 AC:      160.1  mm     G. Age:  21w 1d         52  %    FL/BPD:     68.9   %
 FL:       34.5  mm     G. Age:  20w 6d         41  %    FL/AC:      21.5   %    20 - 24
 HUM:      34.1  mm     G. Age:  21w 4d         67  %
 CER:      21.7  mm     G. Age:  20w 5d         46  %

 LV:        5.5  mm
 CM:          5  mm

 Est. FW:     391  gm    0 lb 14 oz      48  %
OB History

 Gravidity:    2         Term:   1
 Living:       1
Gestational Age

 LMP:           20w 6d        Date:  07/18/18                 EDD:   04/24/19
 U/S Today:     21w 0d                                        EDD:   04/23/19
 Best:          20w 6d     Det. By:  LMP  (07/18/18)          EDD:   04/24/19
Anatomy

 Cranium:               Appears normal         Aortic Arch:            Not well visualized
 Cavum:                 Appears normal         Ductal Arch:            Not well visualized
 Ventricles:            Appears normal         Diaphragm:              Appears normal
 Choroid Plexus:        Appears normal         Stomach:                Appears normal, left
                                                                       sided
 Cerebellum:            Appears normal         Abdomen:                Appears normal
 Posterior Fossa:       Appears normal         Abdominal Wall:         Appears nml (cord
                                                                       insert, abd wall)
 Nuchal Fold:           Not applicable (>20    Cord Vessels:           Appears normal (3
                        wks GA)                                        vessel cord)
 Face:                  Appears normal         Kidneys:                Appear normal
                        (orbits and profile)
 Lips:                  Appears normal         Bladder:                Appears normal
 Thoracic:              Appears normal         Spine:                  Appears normal
 Heart:                 Appears normal         Upper Extremities:      Appears normal
                        (4CH, axis, and
                        situs)
 RVOT:                  Appears normal         Lower Extremities:      Appears normal
 LVOT:                  Appears normal

 Other:  Technically difficult due to fetal position. Nasal bone visualized. Heels
         visualized.  ( Female gender in envelope to pt.)
Cervix Uterus Adnexa

 Cervix
 Length:              4  cm.
 Normal appearance by transabdominal scan.

 Left Ovary
 Within normal limits.

 Right Ovary
 Within normal limits.
Impression

 We performed fetal anatomy scan. No makers of
 aneuploidies or fetal structural defects are seen. Fetal
 biometry is consistent with her previously-established dates.
 Amniotic fluid is normal and good fetal activity is seen.
 On first-trimester screening, the risk of fetal aneuploidies are
 not increased.
Recommendations

 An appointment was made for her to return in 4 weeks for
 completion of fetal anatomy.
                 Katie, Feyishara

## 2019-09-07 IMAGING — US US MFM OB FOLLOW UP
1 series · 14 of 28 positions shown · non-contrast
Comparison: none

[Series 1: us mfm ob follow up · 36 acquisitions, 14 frames shown]
[im 2/36]
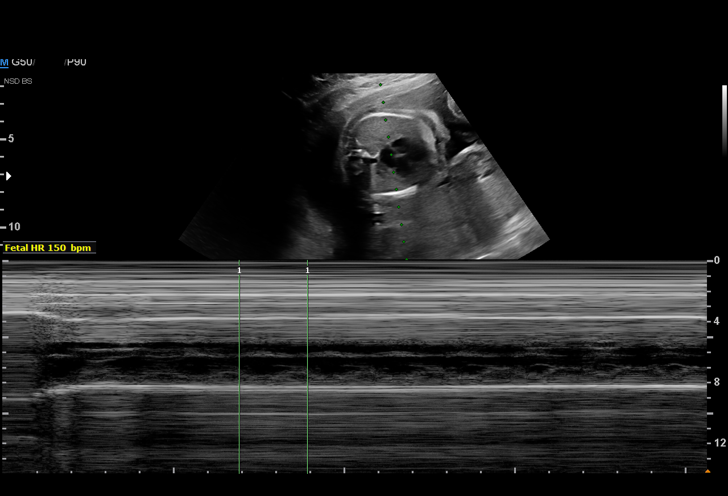
[im 4/36]
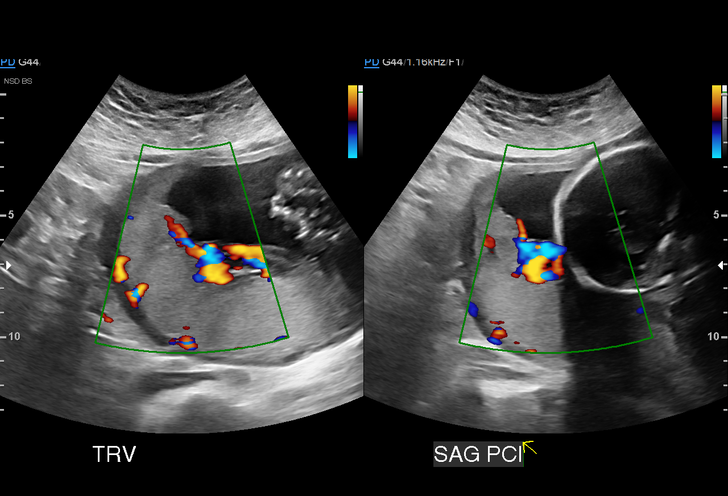
[im 7/36]
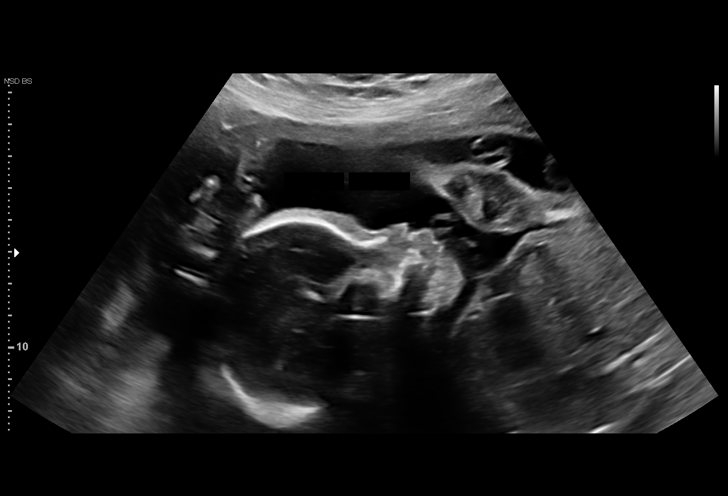
[im 10/36]
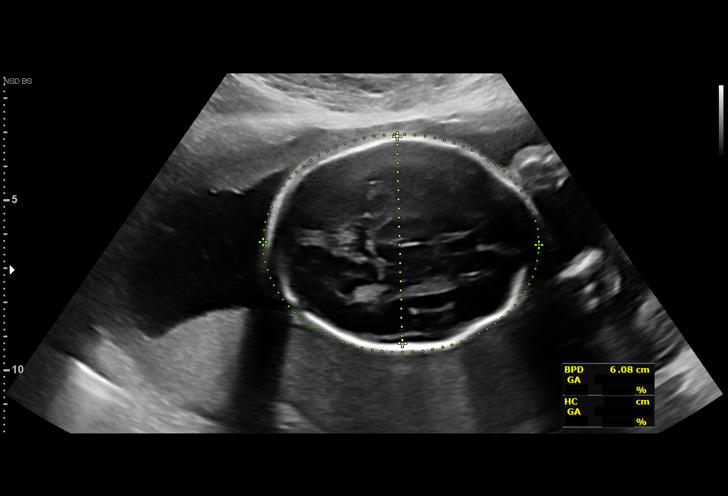
[im 12/36]
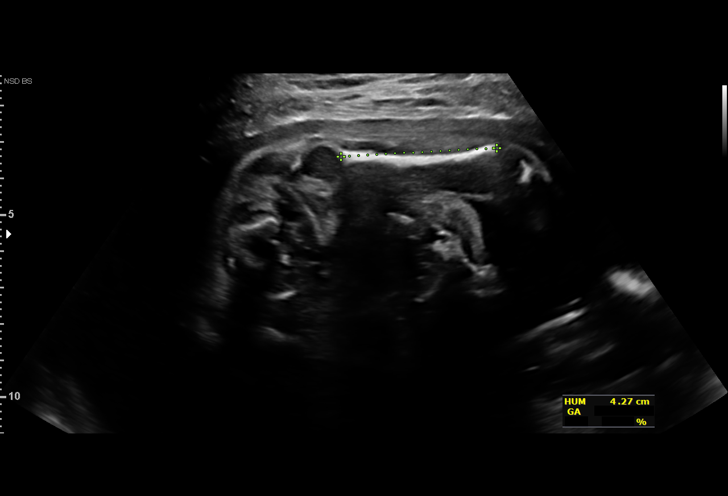
[im 15/36]
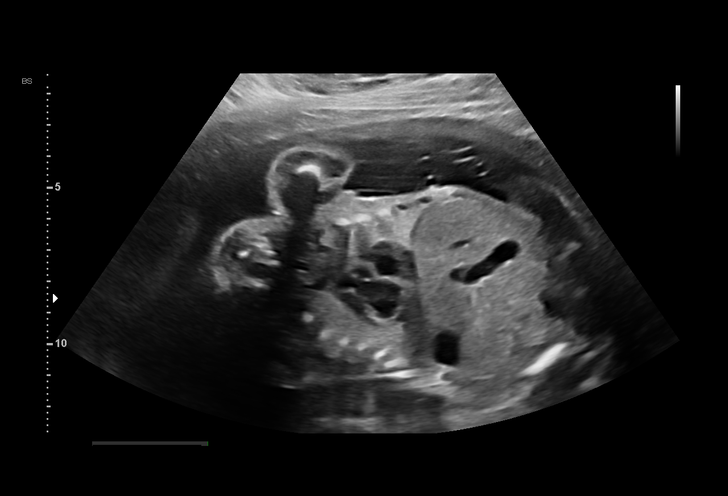
[im 17/36]
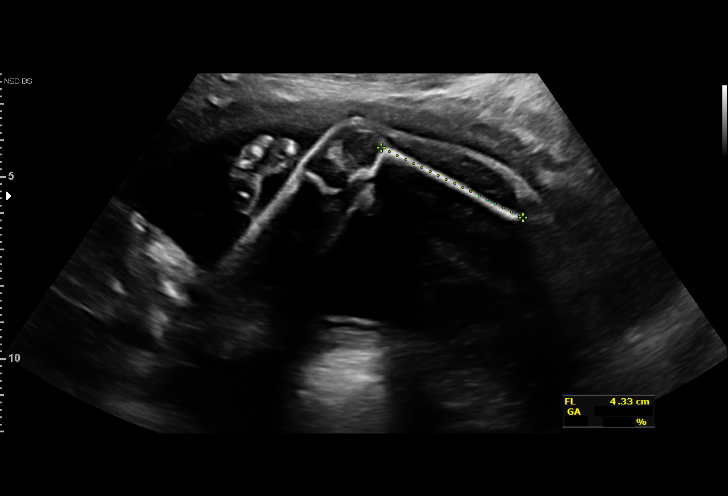
[im 20/36]
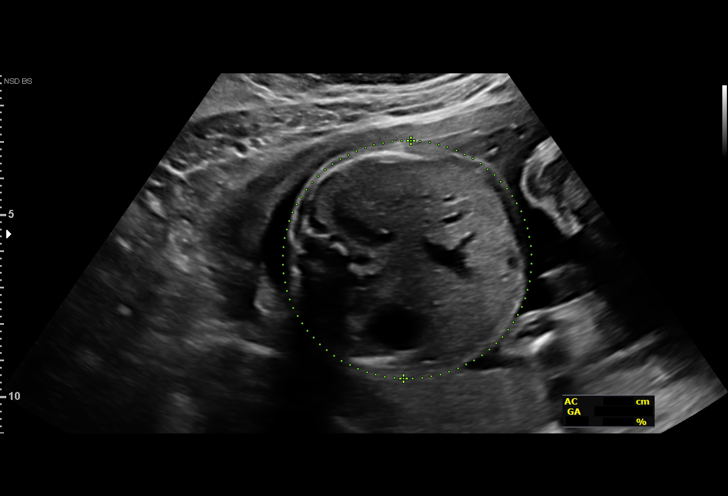
[im 23/36]
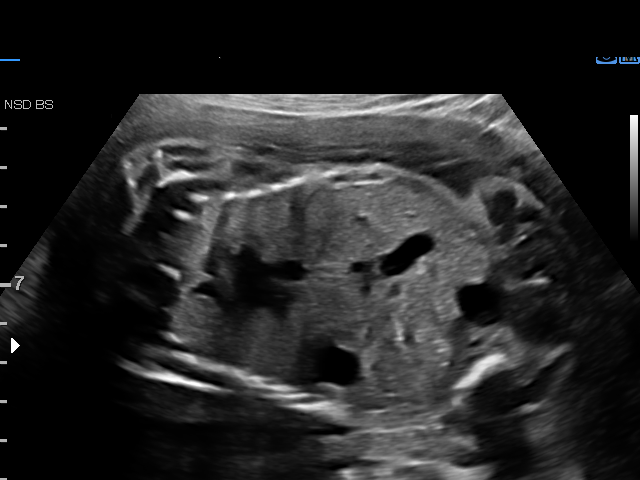
[im 25/36]
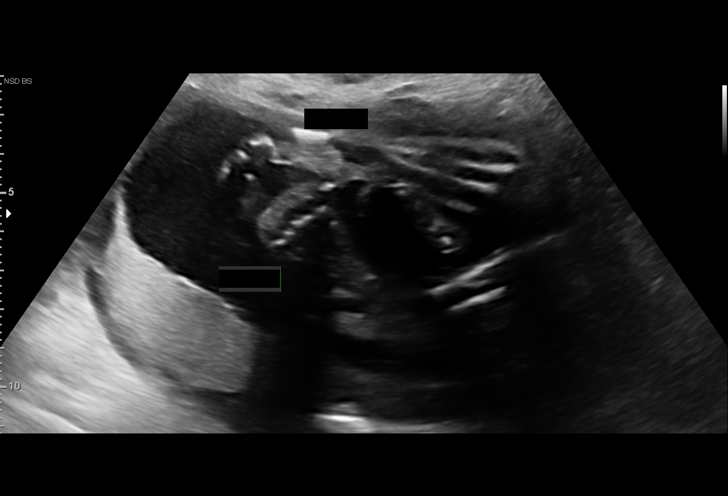
[im 28/36]
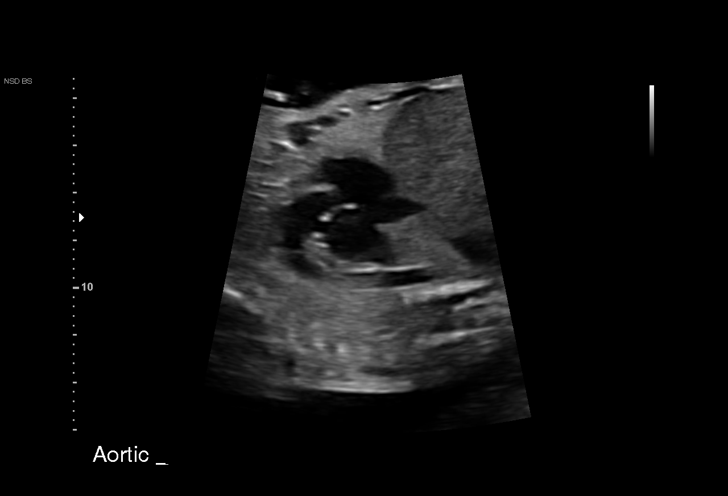
[im 30/36]
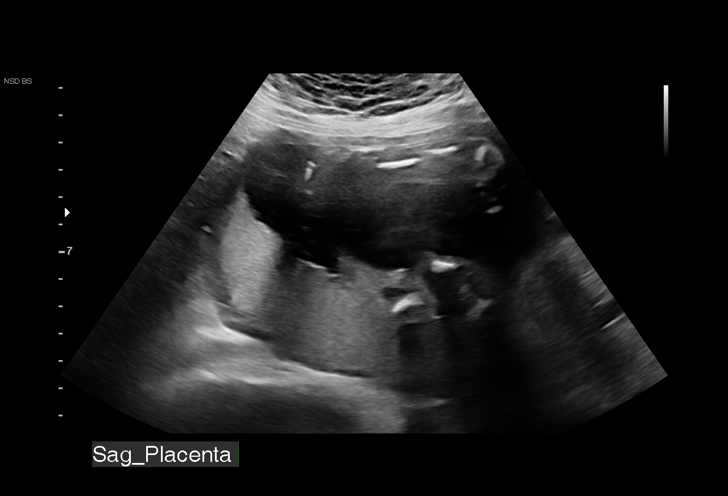
[im 33/36]
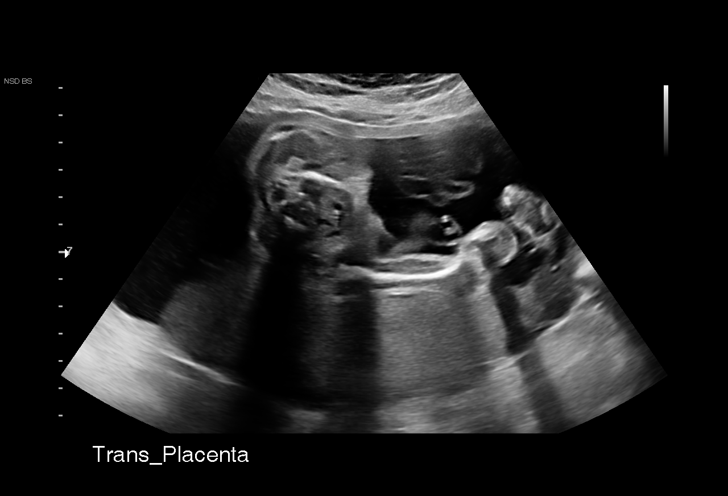
[im 36/36]
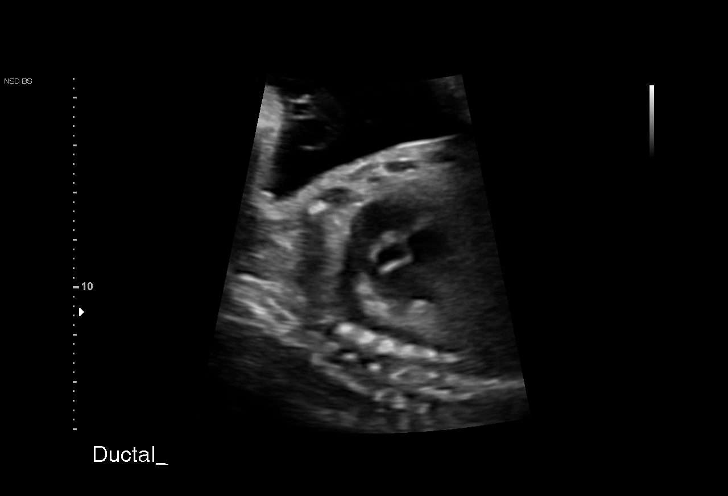

[14 of 28 positions shown; findings below may reference images not displayed]

----------------------------------------------------------------------

 ----------------------------------------------------------------------
Indications

  Encounter for antenatal screening for
  malformations
  24 weeks gestation of pregnancy
  Negative MSAFP
  Low risk NIPS
  Obesity complicating pregnancy, second
  trimester
  Antenatal follow-up for nonvisualized fetal
  anatomy
 ----------------------------------------------------------------------
Vital Signs

                                                Height:        5'4"
Fetal Evaluation

 Num Of Fetuses:         1
 Fetal Heart Rate(bpm):  150
 Cardiac Activity:       Observed
 Presentation:           Breech
 Placenta:               Posterior
 P. Cord Insertion:      Visualized

 Amniotic Fluid
 AFI FV:      Within normal limits

                             Largest Pocket(cm)

Biometry
 BPD:      60.8  mm     G. Age:  24w 5d         38  %    CI:        71.32   %    70 - 86
                                                         FL/HC:      19.3   %    18.7 -
 HC:      229.3  mm     G. Age:  25w 0d         33  %    HC/AC:      1.09        1.04 -
 AC:      209.5  mm     G. Age:  25w 4d         61  %    FL/BPD:     72.9   %    71 - 87
 FL:       44.3  mm     G. Age:  24w 4d         28  %    FL/AC:      21.1   %    20 - 24
 HUM:      42.6  mm     G. Age:  25w 4d         60  %
 LV:        5.5  mm

 Est. FW:     767  gm    1 lb 11 oz      57  %
OB History

 Gravidity:    2         Term:   1
 Living:       1
Gestational Age

 LMP:           24w 6d        Date:  07/18/18                 EDD:   04/24/19
 U/S Today:     25w 0d                                        EDD:   04/23/19
 Best:          24w 6d     Det. By:  LMP  (07/18/18)          EDD:   04/24/19
Anatomy

 Cranium:               Appears normal         Aortic Arch:            Appears normal
 Cavum:                 Previously seen        Ductal Arch:            Appears normal
 Ventricles:            Appears normal         Diaphragm:              Appears normal
 Choroid Plexus:        Previously seen        Stomach:                Appears normal, left
                                                                       sided
 Cerebellum:            Previously seen        Abdomen:                Appears normal
 Posterior Fossa:       Previously seen        Abdominal Wall:         Previously seen
 Nuchal Fold:           Not applicable (>20    Cord Vessels:           Previously seen
                        wks GA)
 Face:                  Orbits and profile     Kidneys:                Appear normal
                        previously seen
 Lips:                  Previously seen        Bladder:                Appears normal
 Thoracic:              Appears normal         Spine:                  Previously seen
 Heart:                 Appears normal         Upper Extremities:      Previously seen
                        (4CH, axis, and
                        situs)
 RVOT:                  Previously seen        Lower Extremities:      Previously seen
 LVOT:                  Previously seen

 Other:  Technically difficult due to fetal position. Nasal bone and Heels
         previously visualized.
Cervix Uterus Adnexa

 Cervix
 Not visualized (advanced GA >32wks)

 Left Ovary
 Previously seen.

 Right Ovary
 Previously seen
Impression

 Normal interval growth.
 Anatomy complete
Recommendations

 Follow up as clinically indicated.

## 2019-09-26 ENCOUNTER — Encounter: Payer: Self-pay | Admitting: Adult Health

## 2019-09-28 DIAGNOSIS — Z20828 Contact with and (suspected) exposure to other viral communicable diseases: Secondary | ICD-10-CM | POA: Diagnosis not present

## 2019-10-02 ENCOUNTER — Encounter: Payer: Self-pay | Admitting: Adult Health

## 2019-10-07 ENCOUNTER — Telehealth: Payer: Self-pay | Admitting: Adult Health

## 2019-10-07 DIAGNOSIS — E559 Vitamin D deficiency, unspecified: Secondary | ICD-10-CM

## 2019-10-07 DIAGNOSIS — Z0000 Encounter for general adult medical examination without abnormal findings: Secondary | ICD-10-CM

## 2019-10-07 NOTE — Telephone Encounter (Signed)
Cancelled Brooke Mclaughlin's pt for CPE & FBW --(rescheduled under Dr. Sharee Holster for 11/10/19 @ 9:00 FBW & 11:00 for CPE--- Lab orders need to be switch to Dr. Kary Kos .  --glh

## 2019-10-09 ENCOUNTER — Encounter: Payer: BLUE CROSS/BLUE SHIELD | Admitting: Adult Health

## 2019-10-09 NOTE — Telephone Encounter (Signed)
Future labs ordered for CPE with Opalski. AS, CMA

## 2019-11-10 ENCOUNTER — Other Ambulatory Visit: Payer: BC Managed Care – PPO

## 2019-11-10 ENCOUNTER — Encounter: Payer: BC Managed Care – PPO | Admitting: Family Medicine

## 2019-11-11 ENCOUNTER — Other Ambulatory Visit: Payer: Self-pay | Admitting: Advanced Practice Midwife

## 2019-11-11 DIAGNOSIS — B001 Herpesviral vesicular dermatitis: Secondary | ICD-10-CM

## 2019-11-11 NOTE — Telephone Encounter (Signed)
Please advise 

## 2019-12-21 DIAGNOSIS — Z3041 Encounter for surveillance of contraceptive pills: Secondary | ICD-10-CM

## 2019-12-22 MED ORDER — LO LOESTRIN FE 1 MG-10 MCG / 10 MCG PO TABS: 1.0000 | ORAL_TABLET | Freq: Every day | ORAL | 4 refills | Status: DC

## 2020-06-17 ENCOUNTER — Ambulatory Visit
Admission: RE | Admit: 2020-06-17 | Discharge: 2020-06-17 | Disposition: A | Discharge status: Home / Self Care | Payer: BC Managed Care – PPO | Source: Ambulatory Visit | Attending: Emergency Medicine | Admitting: Emergency Medicine

## 2020-06-17 ENCOUNTER — Other Ambulatory Visit: Payer: Self-pay

## 2020-06-17 VITALS — BP 139/90 | HR 87 | Temp 98.1°F | Resp 18

## 2020-06-17 DIAGNOSIS — R059 Cough, unspecified: Secondary | ICD-10-CM

## 2020-06-17 MED ORDER — AZITHROMYCIN 250 MG PO TABS: 250.0000 mg | ORAL_TABLET | Freq: Every day | ORAL | 0 refills | Status: DC

## 2020-06-17 MED ORDER — BENZONATATE 100 MG PO CAPS: 100.0000 mg | ORAL_CAPSULE | Freq: Three times a day (TID) | ORAL | 0 refills | Status: DC

## 2020-06-17 NOTE — Discharge Instructions (Addendum)

## 2020-06-17 NOTE — ED Triage Notes (Signed)
Pt c/o cough, sore throat, and congestion for 2wks, cough is getting worse. States has had diarrhea a few time in the past few days.

## 2020-06-17 NOTE — ED Provider Notes (Signed)
[facility]    CSN: [id number] Arrival date & time: 06/17/20  1553      History   Chief Complaint Chief Complaint  Patient presents with  . Cough    HPI Brooke Mclaughlin is a 34 y.o. female  Presenting for persistent cough, sore throat, nasal congestion.  Symptom onset was about 2 weeks ago.  Cough progressively getting worse.  Was initially dry, though has been productive for the last 3 days.  No hemoptysis, shortness of breath or chest pain.  Has had a few episodes of loose stools without blood or melena.  Denies abdominal pain or vomiting, fever.  Taking OTC allergy and antipyretics with relief.  Past Medical History:  Diagnosis Date  . Eczema   . Frequent headaches   . History of cold sores    No genital lesions  . History of Papanicolaou smear of cervix 12/31/13; 05/01/16   NEG; -/-  . Irregular menses   . Migraines   . Palpitations   . Seasonal allergies 10/01/2017  . Syncope 2003   has felt that way since but did not faint  . Urticaria   . UTI (lower urinary tract infection)     Patient Active Problem List   Diagnosis Date Noted  . Acute chorioamnionitis 04/23/2019  . Normal labor 04/21/2019  . SVD (spontaneous vaginal delivery) 04/21/2019  . Obesity (BMI 35.0-39.9 without comorbidity) 10/10/2018  . Supervision of other normal pregnancy, antepartum 09/09/2018  . Obesity affecting pregnancy 09/09/2018  . Vitamin D deficiency 10/01/2017  . Healthcare maintenance 09/10/2017  . Migraines 09/10/2017    Past Surgical History:  Procedure Laterality Date  . ADENOIDECTOMY    . TONSILECTOMY, ADENOIDECTOMY, BILATERAL MYRINGOTOMY AND TUBES Bilateral   . TYMPANOSTOMY TUBE PLACEMENT      OB History    Gravida  2   Para  2   Term  2   Preterm      AB      Living  2     SAB      TAB      Ectopic      Multiple  0   Live Births  2            Home Medications    Prior to Admission medications   Medication Sig Start Date End Date  Taking? Authorizing Provider  acetaminophen (TYLENOL) 325 MG tablet Take 2 tablets (650 mg total) by mouth every 4 (four) hours as needed (for pain scale < 4). 04/23/19   Towanda Octave, MD  azithromycin (ZITHROMAX) 250 MG tablet Take 1 tablet (250 mg total) by mouth daily. Take first 2 tablets together, then 1 every day until finished. 06/17/20   Hall-Potvin, [place], PA-C  benzonatate (TESSALON) 100 MG capsule Take 1 capsule (100 mg total) by mouth every 8 (eight) hours. 06/17/20   Hall-Potvin, [place], PA-C  fexofenadine (ALLEGRA) 60 MG tablet Take 60 mg by mouth 2 (two) times daily.    [provider]  ibuprofen (ADVIL) 600 MG tablet Take 1 tablet (600 mg total) by mouth every 6 (six) hours. 04/23/19   Towanda Octave, MD  LO LOESTRIN FE 1 MG-10 MCG / 10 MCG tablet Take 1 tablet by mouth daily. 12/22/19   Anyanwu, Jethro Bastos, MD  loratadine (CLARITIN) 10 MG tablet Take 10 mg by mouth daily as needed for allergies. Alternates between Allegra and Claritin.    [provider]    Family History Family History  Problem Relation Age of  Onset  . Hypertension Mother        RUNS ON MAT SIDE OF FAMILY  . Migraines Father   . Hypertension Maternal Grandmother   . Heart disease Maternal Grandmother   . Heart attack Maternal Grandmother 28  . Heart disease Maternal Aunt   . Heart attack Maternal Aunt   . Hypertension Maternal Aunt   . Heart disease Maternal Uncle   . Heart attack Maternal Uncle   . Diabetes Maternal Uncle   . Hypertension Maternal Uncle     Social History Social History   Tobacco Use  . Smoking status: Never Smoker  . Smokeless tobacco: Never Used  Vaping Use  . Vaping Use: Never used  Substance Use Topics  . Alcohol use: Not Currently    Alcohol/week: 6.0 standard drinks    Types: 6 Cans of beer per week  . Drug use: No     Allergies   Patient has no known allergies.   Review of Systems Review of Systems  Constitutional: Negative for fatigue and  fever.  HENT: Positive for congestion and sore throat. Negative for ear pain, sinus pain and voice change.   Eyes: Negative for pain, redness and visual disturbance.  Respiratory: Positive for cough. Negative for shortness of breath and wheezing.   Cardiovascular: Negative for chest pain and palpitations.  Gastrointestinal: Negative for abdominal pain, diarrhea and vomiting.  Musculoskeletal: Negative for arthralgias and myalgias.  Skin: Negative for rash and wound.  Neurological: Negative for syncope and headaches.     Physical Exam Triage Vital Signs ED Triage Vitals  Enc Vitals Group     BP      Pulse      Resp      Temp      Temp src      SpO2      Weight      Height      Head Circumference      Peak Flow      Pain Score      Pain Loc      Pain Edu?      Excl. in GC?    No data found.  Updated Vital Signs BP 139/90 (BP Location: Left Arm)   Pulse 87   Temp 98.1 F (36.7 C) (Oral)   Resp 18   LMP 06/03/2020   SpO2 98%   Breastfeeding No   Visual Acuity Right Eye Distance:   Left Eye Distance:   Bilateral Distance:    Right Eye Near:   Left Eye Near:    Bilateral Near:     Physical Exam Constitutional:      General: She is not in acute distress.    Appearance: She is not ill-appearing or diaphoretic.  HENT:     Head: Normocephalic and atraumatic.     Mouth/Throat:     Mouth: Mucous membranes are moist.     Pharynx: Oropharynx is clear. No oropharyngeal exudate or posterior oropharyngeal erythema.  Eyes:     General: No scleral icterus.    Conjunctiva/sclera: Conjunctivae normal.     Pupils: Pupils are equal, round, and reactive to light.  Neck:     Comments: Trachea midline, negative JVD Cardiovascular:     Rate and Rhythm: Normal rate and regular rhythm.     Heart sounds: No murmur heard.  No gallop.   Pulmonary:     Effort: Pulmonary effort is normal. No respiratory distress.     Breath sounds: No stridor or decreased  air movement.  Examination of the left-upper field reveals rales. Rales present. No wheezing or rhonchi.  Musculoskeletal:     Cervical back: Neck supple. No tenderness.  Lymphadenopathy:     Cervical: No cervical adenopathy.  Skin:    Capillary Refill: Capillary refill takes less than 2 seconds.     Coloration: Skin is not jaundiced or pale.     Findings: No rash.  Neurological:     General: No focal deficit present.     Mental Status: She is alert and oriented to person, place, and time.      UC Treatments / Results  Labs (all labs ordered are listed, but only abnormal results are displayed) Labs Reviewed - No data to display  EKG   Radiology No results found.  Procedures Procedures (including critical care time)  Medications Ordered in UC Medications - No data to display  Initial Impression / Assessment and Plan / UC Course  I have reviewed the triage vital signs and the nursing notes.  Pertinent labs & imaging results that were available during my care of the patient were reviewed by me and considered in my medical decision making (see chart for details).     Patient afebrile, nontoxic in office today.  No acute distress and no airway compromise.  SpO2 98%.  Cough has been going on for 2 weeks, though productive for the last 3 days.  LUL rales concerning for forming pneumonia.  Will treat with azithromycin, Tessalon, follow-up with PCP in 1 wk.  Return precautions discussed, pt verbalized understanding and is agreeable to plan. Final Clinical Impressions(s) / UC Diagnoses   Final diagnoses:  Cough     Discharge Instructions     Tessalon for cough. Start flonase, atrovent nasal spray for nasal congestion/drainage. You can use over the counter nasal saline rinse such as neti pot for nasal congestion. Keep hydrated, your urine should be clear to pale yellow in color. Tylenol/motrin for fever and pain. Monitor for any worsening of symptoms, chest pain, shortness of breath,  wheezing, swelling of the throat, go to the emergency department for further evaluation needed.     ED Prescriptions    Medication Sig Dispense Auth. Provider   azithromycin (ZITHROMAX) 250 MG tablet Take 1 tablet (250 mg total) by mouth daily. Take first 2 tablets together, then 1 every day until finished. 6 tablet Hall-Potvin, [place], PA-C   benzonatate (TESSALON) 100 MG capsule Take 1 capsule (100 mg total) by mouth every 8 (eight) hours. 21 capsule Hall-Potvin, [place], PA-C     PDMP not reviewed this encounter.   Hall-Potvin, [place], [place] 06/17/20 1717

## 2020-08-16 ENCOUNTER — Other Ambulatory Visit: Payer: Self-pay | Admitting: Advanced Practice Midwife

## 2020-08-16 DIAGNOSIS — B001 Herpesviral vesicular dermatitis: Secondary | ICD-10-CM

## 2020-11-24 ENCOUNTER — Other Ambulatory Visit: Payer: Self-pay

## 2020-11-24 ENCOUNTER — Ambulatory Visit
Admission: EM | Admit: 2020-11-24 | Discharge: 2020-11-24 | Disposition: A | Discharge status: Home / Self Care | Payer: BC Managed Care – PPO | Attending: Internal Medicine | Admitting: Internal Medicine

## 2020-11-24 ENCOUNTER — Ambulatory Visit: Admit: 2020-11-24 | Disposition: A | Discharge status: Home / Self Care | Payer: BC Managed Care – PPO

## 2020-11-24 ENCOUNTER — Encounter: Payer: Self-pay | Admitting: Emergency Medicine

## 2020-11-24 DIAGNOSIS — J029 Acute pharyngitis, unspecified: Secondary | ICD-10-CM | POA: Diagnosis not present

## 2020-11-24 DIAGNOSIS — Z20818 Contact with and (suspected) exposure to other bacterial communicable diseases: Secondary | ICD-10-CM | POA: Diagnosis not present

## 2020-11-24 LAB — POCT RAPID STREP A (OFFICE): Rapid Strep A Screen: NEGATIVE

## 2020-11-24 MED ORDER — IBUPROFEN 800 MG PO TABS: 800.0000 mg | ORAL_TABLET | Freq: Three times a day (TID) | ORAL | 0 refills | Status: DC

## 2020-11-24 MED ORDER — AMOXICILLIN 500 MG PO CAPS: 500.0000 mg | ORAL_CAPSULE | Freq: Two times a day (BID) | ORAL | 0 refills | Status: AC

## 2020-11-24 NOTE — ED Provider Notes (Signed)
[facility]    CSN: [id number] Arrival date & time: 11/24/20  1619      History   Chief Complaint Chief Complaint  Patient presents with  . Sore Throat    HPI Brooke Mclaughlin is a 35 y.o. female presenting today for evaluation of sore throat.  Sore throat began 3 days ago, has had associated congestion and very minimal cough.  Has had slight abdominal discomfort, but denies vomiting, diarrhea.  Reports daughter diagnosed with strep today.  Has had prior tonsillectomy.   HPI  Past Medical History:  Diagnosis Date  . Eczema   . Frequent headaches   . History of cold sores    No genital lesions  . History of Papanicolaou smear of cervix 12/31/13; 05/01/16   NEG; -/-  . Irregular menses   . Migraines   . Palpitations   . Seasonal allergies 10/01/2017  . Syncope 2003   has felt that way since but did not faint  . Urticaria   . UTI (lower urinary tract infection)     Patient Active Problem List   Diagnosis Date Noted  . Acute chorioamnionitis 04/23/2019  . Normal labor 04/21/2019  . SVD (spontaneous vaginal delivery) 04/21/2019  . Obesity (BMI 35.0-39.9 without comorbidity) 10/10/2018  . Supervision of other normal pregnancy, antepartum 09/09/2018  . Obesity affecting pregnancy 09/09/2018  . Vitamin D deficiency 10/01/2017  . Healthcare maintenance 09/10/2017  . Migraines 09/10/2017    Past Surgical History:  Procedure Laterality Date  . ADENOIDECTOMY    . TONSILECTOMY, ADENOIDECTOMY, BILATERAL MYRINGOTOMY AND TUBES Bilateral   . TYMPANOSTOMY TUBE PLACEMENT      OB History    Gravida  2   Para  2   Term  2   Preterm      AB      Living  2     SAB      IAB      Ectopic      Multiple  0   Live Births  2            Home Medications    Prior to Admission medications   Medication Sig Start Date End Date Taking? Authorizing Provider  amoxicillin (AMOXIL) 500 MG capsule Take 1 capsule (500 mg total) by mouth 2 (two) times daily for  10 days. 11/24/20 12/04/20 Yes Chas Axel C, PA-C  ibuprofen (ADVIL) 800 MG tablet Take 1 tablet (800 mg total) by mouth 3 (three) times daily. 11/24/20  Yes Dossie Swor C, PA-C  acetaminophen (TYLENOL) 325 MG tablet Take 2 tablets (650 mg total) by mouth every 4 (four) hours as needed (for pain scale < 4). 04/23/19   Towanda Octave, MD  fexofenadine (ALLEGRA) 60 MG tablet Take 60 mg by mouth 2 (two) times daily.    [provider]  LO LOESTRIN FE 1 MG-10 MCG / 10 MCG tablet Take 1 tablet by mouth daily. 12/22/19   Anyanwu, Jethro Bastos, MD  loratadine (CLARITIN) 10 MG tablet Take 10 mg by mouth daily as needed for allergies. Alternates between Allegra and Claritin.    [provider]    Family History Family History  Problem Relation Age of Onset  . Hypertension Mother        RUNS ON MAT SIDE OF FAMILY  . Migraines Father   . Hypertension Maternal Grandmother   . Heart disease Maternal Grandmother   . Heart attack Maternal Grandmother 28  . Heart disease Maternal Aunt   .  Heart attack Maternal Aunt   . Hypertension Maternal Aunt   . Heart disease Maternal Uncle   . Heart attack Maternal Uncle   . Diabetes Maternal Uncle   . Hypertension Maternal Uncle     Social History Social History   Tobacco Use  . Smoking status: Never Smoker  . Smokeless tobacco: Never Used  Vaping Use  . Vaping Use: Never used  Substance Use Topics  . Alcohol use: Not Currently    Alcohol/week: 6.0 standard drinks    Types: 6 Cans of beer per week  . Drug use: No     Allergies   Patient has no known allergies.   Review of Systems Review of Systems  Constitutional: Negative for activity change, appetite change, chills, fatigue and fever.  HENT: Positive for congestion and sore throat. Negative for ear pain, rhinorrhea, sinus pressure and trouble swallowing.   Eyes: Negative for discharge and redness.  Respiratory: Positive for cough. Negative for chest tightness and shortness of  breath.   Cardiovascular: Negative for chest pain.  Gastrointestinal: Negative for abdominal pain, diarrhea, nausea and vomiting.  Musculoskeletal: Negative for myalgias.  Skin: Negative for rash.  Neurological: Negative for dizziness, light-headedness and headaches.     Physical Exam Triage Vital Signs ED Triage Vitals  Enc Vitals Group     BP      Pulse      Resp      Temp      Temp src      SpO2      Weight      Height      Head Circumference      Peak Flow      Pain Score      Pain Loc      Pain Edu?      Excl. in GC?    No data found.  Updated Vital Signs BP 126/81 (BP Location: Left Arm)   Pulse 97   Temp 97.9 F (36.6 C) (Oral)   Resp 18   SpO2 98%   Visual Acuity Right Eye Distance:   Left Eye Distance:   Bilateral Distance:    Right Eye Near:   Left Eye Near:    Bilateral Near:     Physical Exam Vitals and nursing note reviewed.  Constitutional:      Appearance: She is well-developed.     Comments: No acute distress  HENT:     Head: Normocephalic and atraumatic.     Ears:     Comments: Bilateral ears without tenderness to palpation of external auricle, tragus and mastoid, EAC's without erythema or swelling, TM's with good bony landmarks and cone of light. Non erythematous.     Nose: Nose normal.     Mouth/Throat:     Comments: Oral mucosa pink and moist, no tonsillar enlargement or exudate. Posterior pharynx patent and nonerythematous, no uvula deviation or swelling. Normal phonation. Eyes:     Conjunctiva/sclera: Conjunctivae normal.  Cardiovascular:     Rate and Rhythm: Normal rate.  Pulmonary:     Effort: Pulmonary effort is normal. No respiratory distress.     Comments: Breathing comfortably at rest, CTABL, no wheezing, rales or other adventitious sounds auscultated Abdominal:     General: There is no distension.  Musculoskeletal:        General: Normal range of motion.     Cervical back: Neck supple.  Skin:    General: Skin is  warm and dry.  Neurological:  Mental Status: She is alert and oriented to person, place, and time.      UC Treatments / Results  Labs (all labs ordered are listed, but only abnormal results are displayed) Labs Reviewed  CULTURE, GROUP A STREP [facility])  POCT RAPID STREP A (OFFICE)    EKG   Radiology No results found.  Procedures Procedures (including critical care time)  Medications Ordered in UC Medications - No data to display  Initial Impression / Assessment and Plan / UC Course  I have reviewed the triage vital signs and the nursing notes.  Pertinent labs & imaging results that were available during my care of the patient were reviewed by me and considered in my medical decision making (see chart for details).     Sore throat-strep negative, strep culture pending, given exposure at home as well as sore throat main symptom opting to go ahead and proceed with treatment with amoxicillin for strep.  Continue symptomatic and supportive care as well.  Discussed strict return precautions. Patient verbalized understanding and is agreeable with plan.  Final Clinical Impressions(s) / UC Diagnoses   Final diagnoses:  Strep throat exposure  Sore throat     Discharge Instructions     Begin amoxicillin twice daily for 10 days Use anti-inflammatories for pain/swelling. You may take up to 800 mg Ibuprofen every 8 hours with food. You may supplement Ibuprofen with Tylenol [phone] mg every 8 hours.  Continue daily zyrtec/claritin to help with drainage/throat irritation Follow up if not improving or worsening    ED Prescriptions    Medication Sig Dispense Auth. Provider   amoxicillin (AMOXIL) 500 MG capsule Take 1 capsule (500 mg total) by mouth 2 (two) times daily for 10 days. 20 capsule Delisha Peaden C, PA-C   ibuprofen (ADVIL) 800 MG tablet Take 1 tablet (800 mg total) by mouth 3 (three) times daily. 21 tablet Maryuri Warnke, [place] C, PA-C     PDMP not reviewed this  encounter.   Lew Dawes, PA-C 11/24/20 1726

## 2020-11-24 NOTE — Discharge Instructions (Addendum)
Begin amoxicillin twice daily for 10 days Use anti-inflammatories for pain/swelling. You may take up to 800 mg Ibuprofen every 8 hours with food. You may supplement Ibuprofen with Tylenol [phone] mg every 8 hours.  Continue daily zyrtec/claritin to help with drainage/throat irritation Follow up if not improving or worsening

## 2020-11-24 NOTE — ED Triage Notes (Signed)
Pt here for sore throat and sts daughter was just diagnosed with strep today

## 2020-11-28 LAB — CULTURE, GROUP A STREP (THRC)

## 2020-11-29 ENCOUNTER — Ambulatory Visit: Payer: BC Managed Care – PPO | Admitting: Nurse Practitioner

## 2021-01-29 ENCOUNTER — Other Ambulatory Visit: Payer: Self-pay | Admitting: Obstetrics & Gynecology

## 2021-01-29 DIAGNOSIS — Z3041 Encounter for surveillance of contraceptive pills: Secondary | ICD-10-CM

## 2021-05-02 ENCOUNTER — Other Ambulatory Visit: Payer: Self-pay

## 2021-05-02 ENCOUNTER — Ambulatory Visit
Admission: EM | Admit: 2021-05-02 | Discharge: 2021-05-02 | Disposition: A | Discharge status: Home / Self Care | Payer: BC Managed Care – PPO | Attending: Emergency Medicine | Admitting: Emergency Medicine

## 2021-05-02 ENCOUNTER — Encounter: Payer: Self-pay | Admitting: Emergency Medicine

## 2021-05-02 DIAGNOSIS — R059 Cough, unspecified: Secondary | ICD-10-CM | POA: Diagnosis not present

## 2021-05-02 DIAGNOSIS — R0989 Other specified symptoms and signs involving the circulatory and respiratory systems: Secondary | ICD-10-CM

## 2021-05-02 DIAGNOSIS — R0602 Shortness of breath: Secondary | ICD-10-CM | POA: Diagnosis not present

## 2021-05-02 DIAGNOSIS — H9203 Otalgia, bilateral: Secondary | ICD-10-CM

## 2021-05-02 MED ORDER — GUAIFENESIN 100 MG/5ML PO SYRP: 100.0000 mg | ORAL_SOLUTION | ORAL | 0 refills | Status: DC | PRN

## 2021-05-02 MED ORDER — ALBUTEROL SULFATE HFA 108 (90 BASE) MCG/ACT IN AERS: 1.0000 | INHALATION_SPRAY | Freq: Four times a day (QID) | RESPIRATORY_TRACT | 0 refills | Status: DC | PRN

## 2021-05-02 MED ORDER — PREDNISONE 10 MG (21) PO TBPK: ORAL_TABLET | Freq: Every day | ORAL | 0 refills | Status: DC

## 2021-05-02 NOTE — ED Triage Notes (Signed)
Pt is present today with a cough, bilateral ear pain, body aches, sore throat, and nasal congestion. Pt states that sx started over two weeks ago.

## 2021-05-02 NOTE — ED Provider Notes (Signed)
[facility]    CSN: [id number] Arrival date & time: 05/02/21  1018      History   Chief Complaint Chief Complaint  Patient presents with   Cough   Otalgia   Generalized Body Aches   Nasal Congestion    HPI Brooke Mclaughlin is a 35 y.o. female.   Pt here for cough, sob, runny nose, not feel well  for 2 weeks now. She thought she would start to feel better by now. Has taken otc meds with no relief. Son and husband have the same at home and was negative for covid. Denies fever, no n/v/d.    Past Medical History:  Diagnosis Date   Eczema    Frequent headaches    History of cold sores    No genital lesions   History of Papanicolaou smear of cervix 12/31/13; 05/01/16   NEG; -/-   Irregular menses    Migraines    Palpitations    Seasonal allergies 10/01/2017   Syncope 2003   has felt that way since but did not faint   Urticaria    UTI (lower urinary tract infection)     Patient Active Problem List   Diagnosis Date Noted   Acute chorioamnionitis 04/23/2019   Normal labor 04/21/2019   SVD (spontaneous vaginal delivery) 04/21/2019   Obesity (BMI 35.0-39.9 without comorbidity) 10/10/2018   Supervision of other normal pregnancy, antepartum 09/09/2018   Obesity affecting pregnancy 09/09/2018   Vitamin D deficiency 10/01/2017   Healthcare maintenance 09/10/2017   Migraines 09/10/2017    Past Surgical History:  Procedure Laterality Date   ADENOIDECTOMY     TONSILECTOMY, ADENOIDECTOMY, BILATERAL MYRINGOTOMY AND TUBES Bilateral    TYMPANOSTOMY TUBE PLACEMENT      OB History     Gravida  2   Para  2   Term  2   Preterm      AB      Living  2      SAB      IAB      Ectopic      Multiple  0   Live Births  2            Home Medications    Prior to Admission medications   Medication Sig Start Date End Date Taking? Authorizing Provider  albuterol (VENTOLIN HFA) 108 (90 Base) MCG/ACT inhaler Inhale 1-2 puffs into the lungs every 6 (six)  hours as needed for wheezing or shortness of breath. 05/02/21  Yes Coralyn Mark, NP  guaifenesin (ROBITUSSIN) 100 MG/5ML syrup Take 5-10 mLs (100-200 mg total) by mouth every 4 (four) hours as needed for cough. 05/02/21  Yes Coralyn Mark, NP  predniSONE (STERAPRED UNI-PAK 21 TAB) 10 MG (21) TBPK tablet Take by mouth daily. Take 6 tabs by mouth daily  for 2 days, then 5 tabs for 2 days, then 4 tabs for 2 days, then 3 tabs for 2 days, 2 tabs for 2 days, then 1 tab by mouth daily for 2 days 05/02/21  Yes Coralyn Mark, NP  acetaminophen (TYLENOL) 325 MG tablet Take 2 tablets (650 mg total) by mouth every 4 (four) hours as needed (for pain scale < 4). 04/23/19   Towanda Octave, MD  fexofenadine (ALLEGRA) 60 MG tablet Take 60 mg by mouth 2 (two) times daily.    [provider]  ibuprofen (ADVIL) 800 MG tablet Take 1 tablet (800 mg total) by mouth 3 (three) times daily. 11/24/20  Wieters, Hallie C, PA-C  LO LOESTRIN FE 1 MG-10 MCG / 10 MCG tablet TAKE 1 TABLET BY MOUTH EVERY DAY 01/31/21   Anyanwu, Jethro Bastos, MD  loratadine (CLARITIN) 10 MG tablet Take 10 mg by mouth daily as needed for allergies. Alternates between Allegra and Claritin.    [provider]    Family History Family History  Problem Relation Age of Onset   Hypertension Mother        RUNS ON MAT SIDE OF FAMILY   Migraines Father    Hypertension Maternal Grandmother    Heart disease Maternal Grandmother    Heart attack Maternal Grandmother 28   Heart disease Maternal Aunt    Heart attack Maternal Aunt    Hypertension Maternal Aunt    Heart disease Maternal Uncle    Heart attack Maternal Uncle    Diabetes Maternal Uncle    Hypertension Maternal Uncle     Social History Social History   Tobacco Use   Smoking status: Never   Smokeless tobacco: Never  Vaping Use   Vaping Use: Never used  Substance Use Topics   Alcohol use: Not Currently    Alcohol/week: 6.0 standard drinks    Types: 6 Cans of  beer per week   Drug use: No     Allergies   Patient has no known allergies.   Review of Systems Review of Systems  Constitutional:  Negative for chills and fever.  HENT:  Positive for congestion, ear pain, postnasal drip, rhinorrhea, sinus pressure, sinus pain, sneezing and sore throat.   Respiratory:  Positive for cough and shortness of breath.   Cardiovascular: Negative.   Gastrointestinal: Negative.   Genitourinary: Negative.   Neurological: Negative.     Physical Exam Triage Vital Signs ED Triage Vitals  Enc Vitals Group     BP 05/02/21 1045 (!) 136/99     Pulse Rate 05/02/21 1045 (!) 112     Resp 05/02/21 1045 19     Temp 05/02/21 1047 98.4 F (36.9 C)     Temp src --      SpO2 05/02/21 1045 95 %     Weight --      Height --      Head Circumference --      Peak Flow --      Pain Score 05/02/21 1044 5     Pain Loc --      Pain Edu? --      Excl. in GC? --    No data found.  Updated Vital Signs BP (!) 136/99   Pulse (!) 112   Temp 98.4 F (36.9 C)   Resp 19   SpO2 95%   Visual Acuity Right Eye Distance:   Left Eye Distance:   Bilateral Distance:    Right Eye Near:   Left Eye Near:    Bilateral Near:     Physical Exam Constitutional:      Appearance: She is ill-appearing.  HENT:     Right Ear: A middle ear effusion is present. Tympanic membrane is bulging.     Left Ear: A middle ear effusion is present. Tympanic membrane is bulging.     Nose: Congestion and rhinorrhea present.     Mouth/Throat:     Mouth: Mucous membranes are moist.  Eyes:     Pupils: Pupils are equal, round, and reactive to light.  Cardiovascular:     Rate and Rhythm: Tachycardia present.  Pulmonary:     Breath sounds:  Normal breath sounds.  Abdominal:     General: Abdomen is flat.  Neurological:     Mental Status: She is alert.     UC Treatments / Results  Labs (all labs ordered are listed, but only abnormal results are displayed) Labs Reviewed  SARS  CORONAVIRUS 2 (TAT 6-24 HRS)    EKG   Radiology No results found.  Procedures Procedures (including critical care time)  Medications Ordered in UC Medications - No data to display  Initial Impression / Assessment and Plan / UC Course  I have reviewed the triage vital signs and the nursing notes.  Pertinent labs & imaging results that were available during my care of the patient were reviewed by me and considered in my medical decision making (see chart for details).     We  will send off covid test check my chart for results in 3 days  Take medications the cough c\an last several weeks   Stay hydrated well  Use humidifier Go to er if symptoms become worse   Final Clinical Impressions(s) / UC Diagnoses   Final diagnoses:  Cough  Ear pain, bilateral  Runny nose  Shortness of breath     Discharge Instructions      We  will send off covid test check my chart for results in 3 days  Take medications the cough c\an last several weeks   Stay hydrated well  Use humidifier Go to er if symptoms become worse         ED Prescriptions     Medication Sig Dispense Auth. Provider   predniSONE (STERAPRED UNI-PAK 21 TAB) 10 MG (21) TBPK tablet Take by mouth daily. Take 6 tabs by mouth daily  for 2 days, then 5 tabs for 2 days, then 4 tabs for 2 days, then 3 tabs for 2 days, 2 tabs for 2 days, then 1 tab by mouth daily for 2 days 42 tablet Maple Mirza L, NP   albuterol (VENTOLIN HFA) 108 (90 Base) MCG/ACT inhaler Inhale 1-2 puffs into the lungs every 6 (six) hours as needed for wheezing or shortness of breath. 90 g Maple Mirza L, NP   guaifenesin (ROBITUSSIN) 100 MG/5ML syrup Take 5-10 mLs (100-200 mg total) by mouth every 4 (four) hours as needed for cough. 60 mL Coralyn Mark, NP      PDMP not reviewed this encounter.   Coralyn Mark, NP 05/02/21 1110

## 2021-05-02 NOTE — Discharge Instructions (Addendum)
We  will send off covid test check my chart for results in 3 days  Take medications the cough c\an last several weeks   Stay hydrated well  Use humidifier Go to er if symptoms become worse

## 2021-07-07 ENCOUNTER — Ambulatory Visit (INDEPENDENT_AMBULATORY_CARE_PROVIDER_SITE_OTHER): Payer: BC Managed Care – PPO | Admitting: Obstetrics and Gynecology

## 2021-07-07 ENCOUNTER — Other Ambulatory Visit: Payer: Self-pay

## 2021-07-07 VITALS — BP 134/84 | HR 76 | Wt 219.0 lb

## 2021-07-07 DIAGNOSIS — Z8742 Personal history of other diseases of the female genital tract: Secondary | ICD-10-CM

## 2021-07-07 DIAGNOSIS — R109 Unspecified abdominal pain: Secondary | ICD-10-CM

## 2021-07-07 LAB — POCT URINE PREGNANCY: Preg Test, Ur: NEGATIVE

## 2021-07-07 NOTE — Progress Notes (Signed)
LMP: 04/14/21. Has started MP today 06/27/21, Heavy and cramping really bad.  CC: Periods, vibrating feeling in belly.

## 2021-07-07 NOTE — Progress Notes (Signed)
Obstetrics and Gynecology Established Patient Evaluation  Appointment Date: 07/07/2021  OBGYN Clinic: [facility]   Primary Care Provider: Carlean Jews  Referring Provider: Carlean Jews, NP  Chief Complaint: irregular periods on lo loestrin. Vibration sensation in left belly  History of Present Illness: Brooke Mclaughlin is a 35 y.o. [id number] (LMP: today), seen for the above chief complaint.   GYN: she has been on lo loestrin since her VD 03/2019 and she breastfed for six months. She states she had regular periods and sometimes two in a month until April of this year where she had a normal period (about a week) and then didn't have one until August and didn't have one until starting this morning.   Belly: for the past three weeks she's had what feels like vibration sensation near the luq of her belly. None for the past two days and she thinks it's brought about by movement. No prior h/o or trauma to the area, diarrhea, constipation, blood in BMs.   Review of Systems: Pertinent items noted in HPI and remainder of comprehensive ROS otherwise negative.    Patient Active Problem List   Diagnosis Date Noted   History of amenorrhea 07/07/2021   Obesity (BMI 35.0-39.9 without comorbidity) 10/10/2018   Vitamin D deficiency 10/01/2017   Healthcare maintenance 09/10/2017   Migraines 09/10/2017    Past Medical History:  Past Medical History:  Diagnosis Date   Eczema    Frequent headaches    History of cold sores    No genital lesions   History of Papanicolaou smear of cervix 12/31/13; 05/01/16   NEG; -/-   Irregular menses    Migraines    Palpitations    Seasonal allergies 10/01/2017   Syncope 2003   has felt that way since but did not faint   Urticaria    UTI (lower urinary tract infection)     Past Surgical History:  Past Surgical History:  Procedure Laterality Date   ADENOIDECTOMY     TONSILECTOMY, ADENOIDECTOMY, BILATERAL MYRINGOTOMY AND  TUBES Bilateral    TYMPANOSTOMY TUBE PLACEMENT      Past Obstetrical History:  OB History  Gravida Para Term Preterm AB Living  2 2 2     2   SAB IAB Ectopic Multiple Live Births        0 2    # Outcome Date GA Lbr Len/2nd Weight Sex Delivery Anes PTL Lv  2 Term 04/21/19 [redacted]w[redacted]d 12:58 / 00:14 7 lb 15 oz (3.6 kg) F Vag-Spont EPI  LIV  1 Term 11/15/10 [redacted]w[redacted]d  8 lb 4 oz (3.742 kg) M Vag-Spont   LIV    Past Gynecological History: As per HPI. History of Pap Smear(s): Yes.   Last pap 04/2019, which was negative and hpv negative.   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  Tobacco Use   Smoking status: Never   Smokeless tobacco: Never  Vaping Use   Vaping Use: Never used  Substance and Sexual Activity   Alcohol use: Not Currently    Alcohol/week: 6.0 standard drinks    Types: 6 Cans of beer per week   Drug use: No   Sexual activity: Yes    Birth  control/protection: Pill  Other Topics Concern   Not on file  Social History Narrative   Lives at home with boyfriend, son, and boyfriend's son   Right handed   Drinks </= 1 cup caffeine daily   Social Determinants of Health   Financial Resource Strain: Not on file  Food Insecurity: Not on file  Transportation Needs: Not on file  Physical Activity: Not on file  Stress: Not on file  Social Connections: Not on file  Intimate Partner Violence: Not on file    Family History:  Family History  Problem Relation Age of Onset   Hypertension Mother        RUNS ON MAT SIDE OF FAMILY   Migraines Father    Hypertension Maternal Grandmother    Heart disease Maternal Grandmother    Heart attack Maternal Grandmother 28   Heart disease Maternal Aunt    Heart attack Maternal Aunt    Hypertension Maternal Aunt     Heart disease Maternal Uncle    Heart attack Maternal Uncle    Diabetes Maternal Uncle    Hypertension Maternal Uncle     Medications Edia T. Dexheimer had no medications administered during this visit. Current Outpatient Medications  Medication Sig Dispense Refill   acetaminophen (TYLENOL) 325 MG tablet Take 2 tablets (650 mg total) by mouth every 4 (four) hours as needed (for pain scale < 4).     fexofenadine (ALLEGRA) 60 MG tablet Take 60 mg by mouth 2 (two) times daily.     LO LOESTRIN FE 1 MG-10 MCG / 10 MCG tablet TAKE 1 TABLET BY MOUTH EVERY DAY 84 tablet 4   loratadine (CLARITIN) 10 MG tablet Take 10 mg by mouth daily as needed for allergies. Alternates between Allegra and Claritin.     albuterol (VENTOLIN HFA) 108 (90 Base) MCG/ACT inhaler Inhale 1-2 puffs into the lungs every 6 (six) hours as needed for wheezing or shortness of breath. (Patient not taking: Reported on 07/07/2021) 90 g 0   guaifenesin (ROBITUSSIN) 100 MG/5ML syrup Take 5-10 mLs (100-200 mg total) by mouth every 4 (four) hours as needed for cough. (Patient not taking: Reported on 07/07/2021) 60 mL 0   ibuprofen (ADVIL) 800 MG tablet Take 1 tablet (800 mg total) by mouth 3 (three) times daily. (Patient not taking: Reported on 07/07/2021) 21 tablet 0   predniSONE (STERAPRED UNI-PAK 21 TAB) 10 MG (21) TBPK tablet Take by mouth daily. Take 6 tabs by mouth daily  for 2 days, then 5 tabs for 2 days, then 4 tabs for 2 days, then 3 tabs for 2 days, 2 tabs for 2 days, then 1 tab by mouth daily for 2 days (Patient not taking: Reported on 07/07/2021) 42 tablet 0   No current facility-administered medications for this visit.   Facility-Administered Medications Ordered in Other Visits  Medication Dose Route Frequency Provider Last Rate Last Admin   gadopentetate dimeglumine (MAGNEVIST) injection 19 mL  19 mL Intravenous Once PRN Anson Fret, MD        Allergies Patient has no known allergies.   Physical Exam:  BP 134/84    Pulse 76   Wt 219 lb (99.3 kg)   BMI 37.59 kg/m  Body mass index is 37.59 kg/m. General appearance: Well nourished, well developed female in no acute distress.  Neck:  Supple, normal appearance, and no thyromegaly  Respiratory:  Normal respiratory effort Abdomen: positive bowel sounds and no masses, hernias; diffusely non tender to palpation, non distended Neuro/Psych:  Normal mood and affect.  Skin:  Warm and dry.   Laboratory: UPT negative  Radiology: none  Assessment: pt stable  Plan:  1. History of amenorrhea I told her that sometimes prolonged OCP can cause amenorrhea or non regular cycles. She is on the placebo pills currently; she is unsure if she was in august and April, but I told her that, also, how the lo loestrin is formulated that amenorrhea or short periods are common. I told her that there's nothing that she needs to do but if she wants to switch to a method that gives her a regular monthly period to let us know. Options include a 21 day pill, ring or the paragard.   Patient wondering about BTL. This was d/w her in regards to permanency, surgical risks and healing, etc. I also d/w her re: vasectomy but her husband is not interested in this.   Pt to stay on lo loestrin for now but to let us know about any other options - POCT urine pregnancy  2. Abdominal discomfort Sounds like muscle spasms. I told her that if comes back and/or is recurrent to let me know and I recommend PT eval as her first next step.    RTC PRN  Cornelia Copa MD Attending [facility])

## 2022-04-10 ENCOUNTER — Ambulatory Visit (INDEPENDENT_AMBULATORY_CARE_PROVIDER_SITE_OTHER): Payer: BC Managed Care – PPO | Admitting: Family Medicine

## 2022-04-10 ENCOUNTER — Encounter: Payer: Self-pay | Admitting: Family Medicine

## 2022-04-10 ENCOUNTER — Other Ambulatory Visit (HOSPITAL_COMMUNITY)
Admission: RE | Admit: 2022-04-10 | Discharge: 2022-04-10 | Disposition: A | Discharge status: Home / Self Care | Payer: BC Managed Care – PPO | Source: Ambulatory Visit | Attending: Family Medicine | Admitting: Family Medicine

## 2022-04-10 VITALS — BP 135/81

## 2022-04-10 DIAGNOSIS — Z3041 Encounter for surveillance of contraceptive pills: Secondary | ICD-10-CM

## 2022-04-10 DIAGNOSIS — Z124 Encounter for screening for malignant neoplasm of cervix: Secondary | ICD-10-CM | POA: Insufficient documentation

## 2022-04-10 DIAGNOSIS — Z01419 Encounter for gynecological examination (general) (routine) without abnormal findings: Secondary | ICD-10-CM

## 2022-04-10 MED ORDER — LO LOESTRIN FE 1 MG-10 MCG / 10 MCG PO TABS: 1.0000 | ORAL_TABLET | Freq: Every day | ORAL | 4 refills | Status: DC

## 2022-04-10 NOTE — Progress Notes (Signed)
   GYNECOLOGY ANNUAL PREVENTATIVE CARE ENCOUNTER NOTE  Subjective:   Brooke Mclaughlin is a 36 y.o. G69P2002 female here for a routine annual gynecologic exam.  Current complaints: Interested in BTL.   Denies abnormal vaginal bleeding, discharge, pelvic pain, problems with intercourse or other gynecologic concerns.    Gynecologic History No LMP recorded. Contraception: OCP (estrogen/progesterone) Last Pap: 2020. Results were: normal Last mammogram: NA- at 39. Had two recent colleagues diagnosed with breast cancer at 40 yo. Discussed that this si rare and patient has nofamily history  Health Maintenance Due  Topic Date Due   COVID-19 Vaccine (1) Never done   Hepatitis C Screening  Never done   INFLUENZA VACCINE  03/21/2022   PAP SMEAR-Modifier  05/18/2022    The following portions of the patient's history were reviewed and updated as appropriate: allergies, current medications, past family history, past medical history, past social history, past surgical history and problem list.  Review of Systems Pertinent items are noted in HPI.   Objective:  BP 135/81  CONSTITUTIONAL: Well-developed, well-nourished female in no acute distress.  HENT:  Normocephalic, atraumatic, External right and left ear normal. Oropharynx is clear and moist EYES:  No scleral icterus.  NECK: Normal range of motion, supple, no masses.  Normal thyroid.  SKIN: Skin is warm and dry. No rash noted. Not diaphoretic. No erythema. No pallor. NEUROLOGIC: Alert and oriented to person, place, and time. Normal reflexes, muscle tone coordination. No cranial nerve deficit noted. PSYCHIATRIC: Normal mood and affect. Normal behavior. Normal judgment and thought content. CARDIOVASCULAR: Normal heart rate noted, regular rhythm. 2+ distal pulses. RESPIRATORY: Effort and breath sounds normal, no problems with respiration noted. BREASTS: Symmetric in size. No masses, skin changes, nipple drainage, or lymphadenopathy. ABDOMEN: Soft,  no  distention noted.  No tenderness, rebound or guarding.  PELVIC: Normal appearing external genitalia; normal appearing vaginal mucosa and cervix.  No abnormal discharge noted.  Pap smear obtained.  Normal uterine size, no other palpable masses, no uterine or adnexal tenderness. MUSCULOSKELETAL: Normal range of motion.    Assessment and Plan:  1) Annual gynecologic examination with pap smear:  Will follow up results of pap smear and manage accordingly. Routine preventative health maintenance measures emphasized.  2) Contraception counseling: Reviewed all forms of birth control options available including abstinence; over the counter/barrier methods; hormonal contraceptive medication including pill, patch, ring, injection,contraceptive implant; hormonal and nonhormonal IUDs; permanent sterilization options including vasectomy and the various tubal sterilization modalities. Risks and benefits reviewed.  Questions were answered.  Written information was also given to the patient to review.  Patient desires OCP but wants a permanent sterilization. OCP was prescribed   1. Screening for cervical cancer - Cytology - PAP  2. Oral contraceptive pill surveillance - LO LOESTRIN FE 1 MG-10 MCG / 10 MCG tablet; Take 1 tablet by mouth daily.  Dispense: 84 tablet; Refill: 4  Please refer to After Visit Summary for other counseling recommendations.   Return in about 2 weeks (around 04/24/2022) for Virtual with GYN Surgeon (pratt, anyanwu, pickens) for BTS planning .  Future Appointments  Date Time Provider Department Center  05/24/2022  8:55 AM Reva Bores, MD [facility]    Federico Flake, MD, MPH, ABFM Attending Physician [facility]

## 2022-04-10 NOTE — Progress Notes (Signed)
Annual Exam  LMP:03/05/22 Irregular cycles  Last Pap:05/19/2019 WNL  No Hx of Abnormal paps. Contraception: PILLS  Needs Refill  STD Screening: Declines  Family Hx of Breast Cancer: None  CC: Negative.

## 2022-04-14 LAB — CYTOLOGY - PAP
Comment: NEGATIVE
Diagnosis: NEGATIVE
High risk HPV: NEGATIVE

## 2022-05-24 ENCOUNTER — Encounter: Payer: Self-pay | Admitting: Family Medicine

## 2022-05-24 ENCOUNTER — Telehealth (INDEPENDENT_AMBULATORY_CARE_PROVIDER_SITE_OTHER): Payer: BC Managed Care – PPO | Admitting: Family Medicine

## 2022-05-24 DIAGNOSIS — Z3009 Encounter for other general counseling and advice on contraception: Secondary | ICD-10-CM | POA: Diagnosis not present

## 2022-05-24 NOTE — Progress Notes (Signed)
    GYNECOLOGY VIRTUAL VISIT ENCOUNTER NOTE  Provider location: [facility]   Patient location: Home  I connected with Brooke Mclaughlin on 05/24/22 at  8:55 AM EDT by MyChart Video Encounter and verified that I am speaking with the correct person using two identifiers.   I discussed the limitations, risks, security and privacy concerns of performing an evaluation and management service virtually and the availability of in person appointments. I also discussed with the patient that there may be a patient responsible charge related to this service. The patient expressed understanding and agreed to proceed.   History:  Brooke Mclaughlin is a 36 y.o. G75P2002 female being evaluated today for sterilization desires. She denies any abnormal vaginal discharge, bleeding, pelvic pain or other concerns.       Past Medical History:  Diagnosis Date   Eczema    Frequent headaches    History of cold sores    No genital lesions   History of Papanicolaou smear of cervix 12/31/13; 05/01/16   NEG; -/-   Irregular menses    Migraines    Palpitations    Seasonal allergies 10/01/2017   Syncope 2003   has felt that way since but did not faint   Urticaria    UTI (lower urinary tract infection)    Past Surgical History:  Procedure Laterality Date   ADENOIDECTOMY     TONSILECTOMY, ADENOIDECTOMY, BILATERAL MYRINGOTOMY AND TUBES Bilateral    TYMPANOSTOMY TUBE PLACEMENT     The following portions of the patient's history were reviewed and updated as appropriate: allergies, current medications, past family history, past medical history, past social history, past surgical history and problem list.   Health Maintenance:  Normal pap and negative HRHPV on 04/10/2022.    Review of Systems:  Pertinent items noted in HPI and remainder of comprehensive ROS otherwise negative.  Physical Exam:   General:  Alert, oriented and cooperative. Patient appears to be in no acute distress.   Mental Status: Normal mood and affect. Normal behavior. Normal judgment and thought content.   Respiratory: Normal respiratory effort, no problems with respiration noted  Rest of physical exam deferred due to type of encounter  Labs and Imaging No results found for this or any previous visit (from the past 336 hour(s)). No results found.     Assessment and Plan:     1. Sterilization consult Patient counseled, r.e. Risks benefits of BTL, including permanency of procedure, discussed benefits of salpingectomy including almost no failure and prevention of ovarian cancer.  Patient verbalized understanding and desires to proceed Risks include but are not limited to bleeding, infection, injury to surrounding structures, including bowel, bladder and ureters, blood clots, and death.  Likelihood of success is high.        I discussed the assessment and treatment plan with the patient. The patient was provided an opportunity to ask questions and all were answered. The patient agreed with the plan and demonstrated an understanding of the instructions.   The patient was advised to call back or seek an in-person evaluation/go to the ED if the symptoms worsen or if the condition fails to improve as anticipated.  I provided 7 minutes of face-to-face time during this encounter.   Donnamae Jude, MD Center for [facility], [place]

## 2023-06-05 ENCOUNTER — Other Ambulatory Visit: Payer: Self-pay | Admitting: *Deleted

## 2023-06-05 DIAGNOSIS — Z3041 Encounter for surveillance of contraceptive pills: Secondary | ICD-10-CM

## 2023-06-05 MED ORDER — LO LOESTRIN FE 1 MG-10 MCG / 10 MCG PO TABS: 1.0000 | ORAL_TABLET | Freq: Every day | ORAL | 0 refills | Status: DC

## 2023-08-23 ENCOUNTER — Other Ambulatory Visit: Payer: Self-pay | Admitting: Family Medicine

## 2023-08-23 DIAGNOSIS — Z3041 Encounter for surveillance of contraceptive pills: Secondary | ICD-10-CM

## 2023-09-24 ENCOUNTER — Ambulatory Visit (INDEPENDENT_AMBULATORY_CARE_PROVIDER_SITE_OTHER): Payer: BC Managed Care – PPO | Admitting: Obstetrics and Gynecology

## 2023-09-24 ENCOUNTER — Encounter: Payer: Self-pay | Admitting: Obstetrics and Gynecology

## 2023-09-24 VITALS — BP 126/82 | HR 86 | Ht 64.0 in | Wt 233.0 lb

## 2023-09-24 DIAGNOSIS — Z01419 Encounter for gynecological examination (general) (routine) without abnormal findings: Secondary | ICD-10-CM

## 2023-09-24 DIAGNOSIS — Z8619 Personal history of other infectious and parasitic diseases: Secondary | ICD-10-CM

## 2023-09-24 DIAGNOSIS — Z3041 Encounter for surveillance of contraceptive pills: Secondary | ICD-10-CM

## 2023-09-24 DIAGNOSIS — Z302 Encounter for sterilization: Secondary | ICD-10-CM

## 2023-09-24 MED ORDER — LO LOESTRIN FE 1 MG-10 MCG / 10 MCG PO TABS: 1.0000 | ORAL_TABLET | Freq: Every day | ORAL | 3 refills | Status: AC

## 2023-09-24 MED ORDER — VALACYCLOVIR HCL 1 G PO TABS: 2000.0000 mg | ORAL_TABLET | Freq: Two times a day (BID) | ORAL | 3 refills | Status: DC

## 2023-09-24 NOTE — Progress Notes (Signed)
ANNUAL EXAM Patient name: Brooke Mclaughlin MRN [id number]  Date of birth: [birth year] Chief Complaint:   Gynecologic Exam  History of Present Illness:   Brooke Mclaughlin is a 38 y.o. [id number] with No LMP recorded. being seen today for a routine annual exam.  Current complaints:   Interested in BTL   Upstream - 09/24/23 1532       Pregnancy Intention Screening   Does the patient want to become pregnant in the next year? No    Does the patient's partner want to become pregnant in the next year? N/A    Would the patient like to discuss contraceptive options today? Yes      Contraception Wrap Up   Current Method Oral Contraceptive    End Method Oral Contraceptive    Contraception Counseling Provided Yes    How was the end contraceptive method provided? Prescription            The pregnancy intention screening data noted above was reviewed. Potential methods of contraception were discussed. The patient elected to proceed with Oral Contraceptive.   Last pap 04/10/22. Results were: NILM w/ HRHPV negative. H/O abnormal pap: no Last mammogram: n/a. Results were: N/A. Family h/o breast cancer: no Last colonoscopy: n/a. Results were: N/A. Family h/o colorectal cancer: no HPV vaccine: unsure     10/08/2018    8:14 AM 10/01/2017    1:15 PM 09/10/2017    1:17 PM  Depression screen PHQ 2/9  Decreased Interest 0 0 0  Down, Depressed, Hopeless 0 0 0  PHQ - 2 Score 0 0 0  Altered sleeping 0 0 0  Tired, decreased energy 1 0 0  Change in appetite 0 0 0  Feeling bad or failure about yourself  0 0 0  Trouble concentrating 0 0 0  Moving slowly or fidgety/restless 0 0 0  Suicidal thoughts 0 0 0  PHQ-9 Score 1 0 0  Difficult doing work/chores Not difficult at all Not difficult at all          No data to display           Review of Systems:   Pertinent items are noted in HPI Denies any headaches, blurred vision, fatigue, shortness of breath, chest pain, abdominal pain, abnormal vaginal  discharge/itching/odor/irritation, problems with periods, bowel movements, urination, or intercourse unless otherwise stated above. Pertinent History Reviewed:  Reviewed past medical,surgical, social and family history.  Reviewed problem list, medications and allergies. Physical Assessment:   Vitals:   09/24/23 1518 09/24/23 1543  BP: (!) 140/82 126/82  Pulse: 86   Weight: 233 lb (105.7 kg)   Height: 5\' 4"  (1.626 m)   Body mass index is 39.99 kg/m.        Physical Examination:   General appearance - well appearing, and in no distress  Mental status - alert, oriented to person, place, and time  Chest - respiratory effort normal  Heart - normal peripheral perfusion  Breasts - deferred after discussion of r/b  Abdomen - soft, nontender, nondistended, no masses or organomegaly  Pelvic - deferred after discussion of r/b  No results found for this or any previous visit (from the past 24 hours).  Assessment & Plan:  1) Well-Woman Exam Mammogram: @ 40yo, or sooner if problems Colonoscopy: @ 39yo, or sooner if problems Pap: Due 2028 Gardasil: Declines GC/CT: Declines HIV/HCV: Offered - declines Offered flu shot, pt declines Birth control pills refilled. Initial BP elevated but recheck normal. Discussed risks  of COCs and uncontrolled HTN, warning si/sx.   2) Cold sores Rx sent for valtrex  3) Sterilization request Pt would like to look into cost w/ her insurance  Discussed virtual pre op appt would be feasible if she decides she would like to proceed with permanent sterilization   Labs/procedures today:   No orders of the defined types were placed in this encounter.  Meds:  Meds ordered this encounter  Medications   valACYclovir (VALTREX) 1000 MG tablet    Sig: Take 2 tablets (2,000 mg total) by mouth 2 (two) times daily.    Dispense:  4 tablet    Refill:  3   Norethindrone-Ethinyl Estradiol-Fe Biphas (LO LOESTRIN FE) 1 MG-10 MCG / 10 MCG tablet    Sig: Take 1 tablet by  mouth daily.    Dispense:  84 tablet    Refill:  3   Follow-up: Return for pre operative appointment for tubal.  Lennart Pall, MD 09/24/2023 3:43 PM

## 2023-09-24 NOTE — Progress Notes (Signed)
Patient presents for Annual.  Last pap: Date: 04/10/22 WNL   per notes next pap in 45yrs. Contraception: OCP Needs refill  Mammogram: Not yet indicated STD Screening: Declines Flu Vaccine : Declines  CC: Annual/None

## 2024-01-21 ENCOUNTER — Encounter: Payer: Self-pay | Admitting: Obstetrics and Gynecology

## 2024-02-13 DIAGNOSIS — M545 Low back pain, unspecified: Secondary | ICD-10-CM | POA: Diagnosis not present

## 2024-02-13 DIAGNOSIS — M533 Sacrococcygeal disorders, not elsewhere classified: Secondary | ICD-10-CM | POA: Diagnosis not present

## 2024-02-27 DIAGNOSIS — M533 Sacrococcygeal disorders, not elsewhere classified: Secondary | ICD-10-CM | POA: Diagnosis not present

## 2024-04-08 ENCOUNTER — Telehealth: Payer: Self-pay

## 2024-04-08 ENCOUNTER — Ambulatory Visit (INDEPENDENT_AMBULATORY_CARE_PROVIDER_SITE_OTHER)

## 2024-04-08 VITALS — BP 125/75 | HR 64 | Temp 97.7°F | Ht 64.0 in | Wt 169.0 lb

## 2024-04-08 DIAGNOSIS — Z130 Encounter for screening for diseases of the blood and blood-forming organs and certain disorders involving the immune mechanism: Secondary | ICD-10-CM | POA: Diagnosis not present

## 2024-04-08 DIAGNOSIS — B001 Herpesviral vesicular dermatitis: Secondary | ICD-10-CM | POA: Insufficient documentation

## 2024-04-08 DIAGNOSIS — Z800 Family history of malignant neoplasm of digestive organs: Secondary | ICD-10-CM | POA: Diagnosis not present

## 2024-04-08 DIAGNOSIS — Z1329 Encounter for screening for other suspected endocrine disorder: Secondary | ICD-10-CM

## 2024-04-08 DIAGNOSIS — Z1321 Encounter for screening for nutritional disorder: Secondary | ICD-10-CM | POA: Diagnosis not present

## 2024-04-08 DIAGNOSIS — M533 Sacrococcygeal disorders, not elsewhere classified: Secondary | ICD-10-CM | POA: Diagnosis not present

## 2024-04-08 DIAGNOSIS — Z1159 Encounter for screening for other viral diseases: Secondary | ICD-10-CM

## 2024-04-08 DIAGNOSIS — Z13228 Encounter for screening for other metabolic disorders: Secondary | ICD-10-CM

## 2024-04-08 DIAGNOSIS — Z0000 Encounter for general adult medical examination without abnormal findings: Secondary | ICD-10-CM

## 2024-04-08 MED ORDER — VALACYCLOVIR HCL 1 G PO TABS: 2000.0000 mg | ORAL_TABLET | ORAL | 2 refills | Status: AC | PRN

## 2024-04-08 NOTE — Progress Notes (Signed)
 New Patient Office Visit  Subjective    Patient ID: Brooke Mclaughlin, female    DOB: 16-Dec-1985  Age: 38 y.o. MRN: [id number]  CC:  Chief Complaint  Patient presents with   New Patient (Initial Visit)    Establishing Care     History of Present Illness   Brooke Mclaughlin is a 38 year old female who presents as a new patient. Was previously seen at this office by Heather , but has not been seen is 4-5 years.   Screenings:  Colon Cancer: Patient's mother (48) recently diagnosed with stage 4 colon cancer. Patient requesting colonoscopy screening. Lung Cancer: N/A Breast Cancer: Mammograms will start next year at 39. No known family hx of breast cancer Cervical cancer: Sees OB/GYN yearly and has her paps done through that office. Diabetes: checking A1c with labs today HLD: Checking lipid panel with labs today The ASCVD Risk score (Arnett DK, et al., 2019) failed to calculate for the following reasons:   The 2019 ASCVD risk score is only valid for ages 30 to 68   Acute Problems: Coccygeal pain - Significant tailbone pain since January - Aching pain, most severe when standing up from a seated position, sometimes causing her to scream out - Pain is primarily present when sitting; absent when standing - Pain can be felt when lying down if moving in certain ways - No radiation down the legs - Pain is not localized to one side - X-ray of coccyx performed at [facility] showed no abnormalities - 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 Denies LBP and also denies saddle anesthesia or loss of bowel/bladder function.  Outpatient Encounter Medications as of 04/08/2024  Medication Sig   acetaminophen  (TYLENOL ) 325 MG tablet Take 2 tablets (650 mg total) by mouth every 4 (four) hours as needed (for pain scale < 4).   fexofenadine (ALLEGRA) 60 MG tablet Take 60 mg by mouth 2 (two) times daily. (Patient taking  differently: Take 60 mg by mouth as needed.)   loratadine (CLARITIN) 10 MG tablet Take 10 mg by mouth daily as needed for allergies. Alternates between Allegra and Claritin.   Norethindrone -Ethinyl Estradiol-Fe Biphas (LO LOESTRIN FE ) 1 MG-10 MCG / 10 MCG tablet Take 1 tablet by mouth daily.   [DISCONTINUED] valACYclovir  (VALTREX ) 1000 MG tablet Take 2 tablets (2,000 mg total) by mouth 2 (two) times daily. (Patient taking differently: Take 2,000 mg by mouth as needed.)   valACYclovir  (VALTREX ) 1000 MG tablet Take 2 tablets (2,000 mg total) by mouth as needed.   Facility-Administered Encounter Medications as of 04/08/2024  Medication   gadopentetate dimeglumine  (MAGNEVIST ) injection 19 mL    Past Medical History:  Diagnosis Date   Eczema    Frequent headaches    History of cold sores    No genital lesions   History of Papanicolaou smear of cervix 12/31/13; 05/01/16   NEG; -/-   Irregular menses    Migraines    Palpitations    Seasonal allergies 10/01/2017   Syncope 2003   has felt that way since but did not faint   Urticaria    UTI (lower urinary tract infection)     Past Surgical History:  Procedure Laterality Date   ADENOIDECTOMY     TONSILECTOMY, ADENOIDECTOMY, BILATERAL MYRINGOTOMY AND TUBES Bilateral    TYMPANOSTOMY TUBE PLACEMENT      Family History  Problem Relation Age of Onset  Hypertension Mother        RUNS ON MAT SIDE OF FAMILY   Migraines Father    Hypertension Maternal Grandmother    Heart disease Maternal Grandmother    Heart attack Maternal Grandmother 28   Heart disease Maternal Aunt    Heart attack Maternal Aunt    Hypertension Maternal Aunt    Heart disease Maternal Uncle    Heart attack Maternal Uncle    Diabetes Maternal Uncle    Hypertension Maternal Uncle     Social History   Socioeconomic History   Marital status: Single    Spouse name: Not on file   Number of children: 1   Years of education: 16   Highest education level: Not on file   Occupational History   Occupation: ENGINEER    Comment: Doctor, hospital  Tobacco Use   Smoking status: Never   Smokeless tobacco: Never  Vaping Use   Vaping status: Never Used  Substance and Sexual Activity   Alcohol use: Not Currently    Alcohol/week: 6.0 standard drinks of alcohol    Types: 6 Cans of beer per week   Drug use: No   Sexual activity: Yes    Birth control/protection: Pill  Other Topics Concern   Not on file  Social History Narrative   Lives at home with boyfriend, son, and boyfriend's son   Right handed   Drinks </= 1 cup caffeine daily   Social Drivers of Corporate investment banker Strain: Low Risk  (04/17/2019)   Overall Financial Resource Strain (CARDIA)    Difficulty of Paying Living Expenses: Not hard at all  Food Insecurity: No Food Insecurity (04/17/2019)   Hunger Vital Sign    Worried About Running Out of Food in the Last Year: Never true    Ran Out of Food in the Last Year: Never true  Transportation Needs: No Transportation Needs (06/25/2017)   PRAPARE - Administrator, Civil Service (Medical): No    Lack of Transportation (Non-Medical): No  Physical Activity: Not on file  Stress: Not on file  Social Connections: Not on file  Intimate Partner Violence: Not on file    ROS  Per HPI      Objective    BP 125/75   Pulse 64   Temp 97.7 F (36.5 C) (Oral)   Ht 5' 4 (1.626 m)   Wt 169 lb (76.7 kg)   SpO2 100%   BMI 29.01 kg/m   Physical Exam Constitutional:      General: She is not in acute distress.    Appearance: Normal appearance.  HENT:     Right Ear: Tympanic membrane normal.     Left Ear: Tympanic membrane normal.     Mouth/Throat:     Mouth: Mucous membranes are moist.     Pharynx: Oropharynx is clear.  Eyes:     Pupils: Pupils are equal, round, and reactive to light.  Cardiovascular:     Rate and Rhythm: Normal rate and regular rhythm.     Heart sounds: Normal heart sounds. No murmur heard.    No friction rub. No  gallop.  Pulmonary:     Effort: Pulmonary effort is normal. No respiratory distress.     Breath sounds: Normal breath sounds.  Abdominal:     General: Abdomen is flat. Bowel sounds are normal.     Palpations: Abdomen is soft.  Musculoskeletal:        General: No swelling.  Cervical back: Normal range of motion.  Lymphadenopathy:     Cervical: No cervical adenopathy.  Skin:    General: Skin is warm and dry.  Neurological:     General: No focal deficit present.     Mental Status: She is alert.  Psychiatric:        Mood and Affect: Mood normal.        Behavior: Behavior normal.        Thought Content: Thought content normal.        Assessment & Plan:   Screening for endocrine, nutritional, metabolic and immunity disorder -     VITAMIN D  25 Hydroxy (Vit-D Deficiency, Fractures) -     TSH -     Hemoglobin A1c -     Lipid panel -     Comprehensive metabolic panel with GFR -     CBC with Differential/Platelet -     Hepatitis C antibody  Screening for viral disease -     Hepatitis C antibody  Family history of colon cancer requiring screening colonoscopy -     Ambulatory referral to Gastroenterology  Coccydynia Assessment & Plan: Chronic pain since January, worsened by sitting and standing. X-ray normal, likely muscle or nerve inflammation. Pain affects daily activities. - Order MRI to evaluate muscles and nerves in the tailbone area. - Consider steroid injection if MRI shows nerve inflammation.  Orders: -     MR SACRUM SI JOINTS WO CONTRAST; Future  Healthcare maintenance Assessment & Plan: Labs today (see orders). Discussed health screenings to include paps through OB, colonoscopy ordered today due to direct family history of colon cancer, mammograms starting next year at 25. Will plan for next CPE in 1 year, sooner pending bloodwork results or PRN.   Family history of colon cancer Assessment & Plan: Mother had stage four colon cancer at 50. Recent diarrhea  increase, no blood, considered with family history. - Place referral for colonoscopy at [facility] gastro office in [place].   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    Other orders -     valACYclovir  HCl; Take 2 tablets (2,000 mg total) by mouth as needed.  Dispense: 30 tablet; Refill: 2    Return in about 1 year (around 04/08/2025) for Physical.   Saddie JULIANNA Sacks, PA-C

## 2024-04-08 NOTE — Telephone Encounter (Signed)
 Message patient per provider - Brooke Mclaughlin

## 2024-04-08 NOTE — Progress Notes (Deleted)
 New Patient Office Visit  Subjective    Patient ID: Brooke Mclaughlin, female    DOB: 03/11/1986  Age: 38 y.o. MRN: [id number]  CC:  Chief Complaint  Patient presents with  . New Patient (Initial Visit)    Establishing Care    Discussed the use of AI scribe software for clinical note transcription with the patient, who gave verbal consent to proceed.  History of Present Illness     HPI Brooke Mclaughlin presents to establish care. Prior PCP was *** at ***. Last physical was ***. she notes that she requires refills of ***.  Screenings:  Colon Cancer: *** Lung Cancer: *** Breast Cancer: *** Diabetes: *** Ophthalmology*** Foot*** HLD: ***  The ASCVD Risk score (Arnett DK, et al., 2019) failed to calculate for the following reasons:   The 2019 ASCVD risk score is only valid for ages 57 to 62 Hep B Vax: ***  Acute Problems: ***  Outpatient Encounter Medications as of 04/08/2024  Medication Sig  . acetaminophen  (TYLENOL ) 325 MG tablet Take 2 tablets (650 mg total) by mouth every 4 (four) hours as needed (for pain scale < 4).  . fexofenadine (ALLEGRA) 60 MG tablet Take 60 mg by mouth 2 (two) times daily. (Patient taking differently: Take 60 mg by mouth as needed.)  . loratadine (CLARITIN) 10 MG tablet Take 10 mg by mouth daily as needed for allergies. Alternates between Allegra and Claritin.  . Norethindrone -Ethinyl Estradiol-Fe Biphas (LO LOESTRIN FE ) 1 MG-10 MCG / 10 MCG tablet Take 1 tablet by mouth daily.  . valACYclovir  (VALTREX ) 1000 MG tablet Take 2 tablets (2,000 mg total) by mouth 2 (two) times daily. (Patient taking differently: Take 2,000 mg by mouth as needed.)   Facility-Administered Encounter Medications as of 04/08/2024  Medication  . gadopentetate dimeglumine  (MAGNEVIST ) injection 19 mL    Past Medical History:  Diagnosis Date  . Eczema   . Frequent headaches   . History of cold sores    No genital lesions  . History of Papanicolaou smear of cervix 12/31/13;  05/01/16   NEG; -/-  . Irregular menses   . Migraines   . Palpitations   . Seasonal allergies 10/01/2017  . Syncope 2003   has felt that way since but did not faint  . Urticaria   . UTI (lower urinary tract infection)     Past Surgical History:  Procedure Laterality Date  . ADENOIDECTOMY    . TONSILECTOMY, ADENOIDECTOMY, BILATERAL MYRINGOTOMY AND TUBES Bilateral   . TYMPANOSTOMY TUBE PLACEMENT      Family History  Problem Relation Age of Onset  . Hypertension Mother        RUNS ON MAT SIDE OF FAMILY  . Migraines Father   . Hypertension Maternal Grandmother   . Heart disease Maternal Grandmother   . Heart attack Maternal Grandmother 28  . Heart disease Maternal Aunt   . Heart attack Maternal Aunt   . Hypertension Maternal Aunt   . Heart disease Maternal Uncle   . Heart attack Maternal Uncle   . Diabetes Maternal Uncle   . Hypertension Maternal Uncle     Social History   Socioeconomic History  . Marital status: Single    Spouse name: Not on file  . Number of children: 1  . Years of education: 76  . Highest education level: Not on file  Occupational History  . Occupation: ENGINEER    Comment: DRAFTING  Tobacco Use  . Smoking status: Never  .  Smokeless tobacco: Never  Vaping Use  . Vaping status: Never Used  Substance and Sexual Activity  . Alcohol use: Not Currently    Alcohol/week: 6.0 standard drinks of alcohol    Types: 6 Cans of beer per week  . Drug use: No  . Sexual activity: Yes    Birth control/protection: Pill  Other Topics Concern  . Not on file  Social History Narrative   Lives at home with boyfriend, son, and boyfriend's son   Right handed   Drinks </= 1 cup caffeine daily   Social Drivers of Health   Financial Resource Strain: Low Risk  (04/17/2019)   Overall Financial Resource Strain (CARDIA)   . Difficulty of Paying Living Expenses: Not hard at all  Food Insecurity: No Food Insecurity (04/17/2019)   Hunger Vital Sign   . Worried About  Programme researcher, broadcasting/film/video in the Last Year: Never true   . Ran Out of Food in the Last Year: Never true  Transportation Needs: No Transportation Needs (06/25/2017)   PRAPARE - Transportation   . Lack of Transportation (Medical): No   . Lack of Transportation (Non-Medical): No  Physical Activity: Not on file  Stress: Not on file  Social Connections: Not on file  Intimate Partner Violence: Not on file    ROS  Per HPI      Objective    BP 125/75   Pulse 64   Temp 97.7 F (36.5 C) (Oral)   Ht 5' 4 (1.626 m)   Wt 169 lb (76.7 kg)   SpO2 100%   BMI 29.01 kg/m   Physical Exam  {Labs (Optional):23779}    Assessment & Plan:   There are no diagnoses linked to this encounter.  Assessment and Plan Assessment & Plan      No follow-ups on file.   Saddie JULIANNA Sacks, PA-C

## 2024-04-08 NOTE — Assessment & Plan Note (Signed)
 Chronic pain since January, worsened by sitting and standing. X-ray normal, likely muscle or nerve inflammation. Pain affects daily activities. - Order MRI to evaluate muscles and nerves in the tailbone area. - Consider steroid injection if MRI shows nerve inflammation.

## 2024-04-08 NOTE — Patient Instructions (Signed)
 VISIT SUMMARY: Today, we discussed your ongoing tailbone pain, recent changes in bowel habits, and a family history of colon cancer. We also addressed your cold sore outbreaks.  YOUR PLAN: -CHRONIC TAILBONE PAIN: Chronic tailbone pain refers to persistent pain in the tailbone area, often due to muscle or nerve inflammation. We will order an MRI to get a detailed look at the muscles and nerves in your tailbone area. Depending on the results, we may consider a steroid injection to reduce inflammation.  -FAMILY HISTORY OF COLON CANCER: Given your mother's history of stage four colon cancer and your recent increase in diarrhea, it is important to screen for colon cancer. We will refer you for a colonoscopy at [facility] gastro office in [place] to check for any abnormalities.  -HERPES LABIALIS: Herpes labialis, commonly known as cold sores, are caused by the herpes simplex virus. We have prescribed Valtrex  (valacyclovir ) to help manage and reduce the frequency of your cold sore outbreaks.  INSTRUCTIONS: Please schedule an MRI for your tailbone area and a colonoscopy at [facility] gastro office in [place]. Follow the prescribed dosage for Valtrex  (valacyclovir ) as directed. Be on the lookout for a MyChart message from me with the results of your labwork.   If you have any problems before your next visit feel free to message me via MyChart (minor issues or questions) or call the office, otherwise you may reach out to schedule an office visit.  Thank you! Saddie Sacks, PA-C

## 2024-04-08 NOTE — Assessment & Plan Note (Signed)
 Labs today (see orders). Discussed health screenings to include paps through OB, colonoscopy ordered today due to direct family history of colon cancer, mammograms starting next year at 61. Will plan for next CPE in 1 year, sooner pending bloodwork results or PRN.

## 2024-04-08 NOTE — Assessment & Plan Note (Signed)
 Mother had stage four colon cancer at 62. Recent diarrhea increase, no blood, considered with family history. - Place referral for colonoscopy at [facility] gastro office in [place].

## 2024-04-08 NOTE — Assessment & Plan Note (Signed)
 Refill Valtrex  (valacyclovir ) for cold sore outbreaks.

## 2024-04-08 NOTE — Addendum Note (Signed)
 Addended byBETHA GAYLE NUMBERS on: 04/08/2024 03:36 PM   Modules accepted: Orders

## 2024-04-08 NOTE — Telephone Encounter (Signed)
 Error

## 2024-04-09 ENCOUNTER — Ambulatory Visit: Payer: Self-pay

## 2024-04-09 LAB — CBC WITH DIFFERENTIAL/PLATELET
Basophils Absolute: 0 x10E3/uL (ref 0.0–0.2)
Basos: 1 %
EOS (ABSOLUTE): 0.1 x10E3/uL (ref 0.0–0.4)
Eos: 3 %
Hematocrit: 43.5 % (ref 34.0–46.6)
Hemoglobin: 13.9 g/dL (ref 11.1–15.9)
Immature Grans (Abs): 0 x10E3/uL (ref 0.0–0.1)
Immature Granulocytes: 0 %
Lymphocytes Absolute: 1.2 x10E3/uL (ref 0.7–3.1)
Lymphs: 29 %
MCH: 32.1 pg (ref 26.6–33.0)
MCHC: 32 g/dL (ref 31.5–35.7)
MCV: 101 fL — ABNORMAL HIGH (ref 79–97)
Monocytes Absolute: 0.2 x10E3/uL (ref 0.1–0.9)
Monocytes: 6 %
Neutrophils Absolute: 2.6 x10E3/uL (ref 1.4–7.0)
Neutrophils: 61 %
Platelets: 242 x10E3/uL (ref 150–450)
RBC: 4.33 x10E6/uL (ref 3.77–5.28)
RDW: 11.8 % (ref 11.7–15.4)
WBC: 4.2 x10E3/uL (ref 3.4–10.8)

## 2024-04-09 LAB — LIPID PANEL
Chol/HDL Ratio: 3.3 ratio (ref 0.0–4.4)
Cholesterol, Total: 153 mg/dL (ref 100–199)
HDL: 47 mg/dL (ref >39)
LDL Chol Calc (NIH): 91 mg/dL (ref 0–99)
Triglycerides: 78 mg/dL (ref 0–149)
VLDL Cholesterol Cal: 15 mg/dL (ref 5–40)

## 2024-04-09 LAB — COMPREHENSIVE METABOLIC PANEL WITH GFR
ALT: 16 IU/L (ref 0–32)
AST: 15 IU/L (ref 0–40)
Albumin: 4.3 g/dL (ref 3.9–4.9)
Alkaline Phosphatase: 35 IU/L — ABNORMAL LOW (ref 44–121)
BUN/Creatinine Ratio: 14 (ref 9–23)
BUN: 10 mg/dL (ref 6–20)
Bilirubin Total: 0.5 mg/dL (ref 0.0–1.2)
CO2: 18 mmol/L — ABNORMAL LOW (ref 20–29)
Calcium: 9.3 mg/dL (ref 8.7–10.2)
Chloride: 106 mmol/L (ref 96–106)
Creatinine, Ser: 0.74 mg/dL (ref 0.57–1.00)
Globulin, Total: 2.4 g/dL (ref 1.5–4.5)
Glucose: 91 mg/dL (ref 70–99)
Potassium: 4.2 mmol/L (ref 3.5–5.2)
Sodium: 139 mmol/L (ref 134–144)
Total Protein: 6.7 g/dL (ref 6.0–8.5)
eGFR: 106 mL/min/1.73 (ref >59)

## 2024-04-09 LAB — VITAMIN D 25 HYDROXY (VIT D DEFICIENCY, FRACTURES): Vit D, 25-Hydroxy: 32.7 ng/mL (ref 30.0–100.0)

## 2024-04-09 LAB — HEMOGLOBIN A1C
Est. average glucose Bld gHb Est-mCnc: 94 mg/dL
Hgb A1c MFr Bld: 4.9 % (ref 4.8–5.6)

## 2024-04-09 LAB — HEPATITIS C ANTIBODY: Hep C Virus Ab: NONREACTIVE

## 2024-04-09 LAB — TSH: TSH: 1.84 u[IU]/mL (ref 0.450–4.500)

## 2024-04-28 ENCOUNTER — Encounter: Payer: Self-pay | Admitting: Gastroenterology

## 2024-05-07 ENCOUNTER — Ambulatory Visit (HOSPITAL_COMMUNITY)
Admission: RE | Admit: 2024-05-07 | Discharge: 2024-05-07 | Disposition: A | Discharge status: Home / Self Care | Source: Ambulatory Visit

## 2024-05-07 DIAGNOSIS — M533 Sacrococcygeal disorders, not elsewhere classified: Secondary | ICD-10-CM | POA: Diagnosis not present

## 2024-05-12 ENCOUNTER — Other Ambulatory Visit: Payer: Self-pay

## 2024-05-12 DIAGNOSIS — M533 Sacrococcygeal disorders, not elsewhere classified: Secondary | ICD-10-CM

## 2024-05-13 ENCOUNTER — Other Ambulatory Visit

## 2024-05-13 DIAGNOSIS — M533 Sacrococcygeal disorders, not elsewhere classified: Secondary | ICD-10-CM | POA: Diagnosis not present

## 2024-05-13 NOTE — Progress Notes (Signed)
 error

## 2024-05-19 LAB — RHEUMATOID FACTOR: Rheumatoid fact SerPl-aCnc: <10 [IU]/mL (ref <14.0)

## 2024-05-19 LAB — CYCLIC CITRUL PEPTIDE ANTIBODY, IGG/IGA: Cyclic Citrullin Peptide Ab: 11 U (ref 0–19)

## 2024-05-19 LAB — SEDIMENTATION RATE: Sed Rate: 2 mm/h (ref 0–32)

## 2024-05-19 LAB — C-REACTIVE PROTEIN: CRP: 3 mg/L (ref 0–10)

## 2024-05-19 LAB — ANA: Anti Nuclear Antibody (ANA): NEGATIVE

## 2024-05-19 LAB — HLA-B27 ANTIGEN

## 2024-05-20 ENCOUNTER — Ambulatory Visit: Payer: Self-pay

## 2024-05-25 NOTE — Progress Notes (Unsigned)
 Brooke Mclaughlin [id number] 1986-06-20   Chief Complaint:  Referring Provider: Gayle Saddie FALCON, PA-C Primary GI MD: Sampson  HPI: Brooke Mclaughlin is a 38 y.o. female with past medical history of eczema, migraines, family history of colon cancer who presents today discuss colonoscopy.    Seen by PCP 04/08/2024.  Per note patient's mother was recently diagnosed with stage IV colon cancer at age 81.   Previous GI Procedures/Imaging      Past Medical History:  Diagnosis Date   Eczema    Frequent headaches    History of cold sores    No genital lesions   History of Papanicolaou smear of cervix 12/31/13; 05/01/16   NEG; -/-   Irregular menses    Migraines    Palpitations    Seasonal allergies 10/01/2017   Syncope 2003   has felt that way since but did not faint   Urticaria    UTI (lower urinary tract infection)     Past Surgical History:  Procedure Laterality Date   ADENOIDECTOMY     TONSILECTOMY, ADENOIDECTOMY, BILATERAL MYRINGOTOMY AND TUBES Bilateral    TYMPANOSTOMY TUBE PLACEMENT      Current Outpatient Medications  Medication Sig Dispense Refill   acetaminophen  (TYLENOL ) 325 MG tablet Take 2 tablets (650 mg total) by mouth every 4 (four) hours as needed (for pain scale < 4).     fexofenadine (ALLEGRA) 60 MG tablet Take 60 mg by mouth 2 (two) times daily. (Patient taking differently: Take 60 mg by mouth as needed.)     loratadine (CLARITIN) 10 MG tablet Take 10 mg by mouth daily as needed for allergies. Alternates between Allegra and Claritin.     Norethindrone -Ethinyl Estradiol-Fe Biphas (LO LOESTRIN FE ) 1 MG-10 MCG / 10 MCG tablet Take 1 tablet by mouth daily. 84 tablet 3   valACYclovir  (VALTREX ) 1000 MG tablet Take 2 tablets (2,000 mg total) by mouth as needed. 30 tablet 2   No current facility-administered medications for this visit.   Facility-Administered Medications Ordered in Other Visits  Medication Dose Route Frequency Provider Last Rate Last Admin    gadopentetate dimeglumine  (MAGNEVIST ) injection 19 mL  19 mL Intravenous Once PRN Ines Onetha NOVAK, MD        Allergies as of 05/26/2024   (No Known Allergies)    Family History  Problem Relation Age of Onset   Hypertension Mother        RUNS ON MAT SIDE OF FAMILY   Colon cancer Mother    Migraines Father    Heart disease Maternal Aunt    Heart attack Maternal Aunt    Hypertension Maternal Aunt    Heart disease Maternal Uncle    Heart attack Maternal Uncle    Diabetes Maternal Uncle    Hypertension Maternal Uncle    Hypertension Maternal Grandmother    Heart disease Maternal Grandmother    Heart attack Maternal Grandmother 38    Social History   Tobacco Use   Smoking status: Never   Smokeless tobacco: Never  Vaping Use   Vaping status: Never Used  Substance Use Topics   Alcohol use: Not Currently    Alcohol/week: 6.0 standard drinks of alcohol    Types: 6 Cans of beer per week   Drug use: No     Review of Systems:    Constitutional: No weight loss, fever, chills, weakness or fatigue Eyes: No change in vision Ears, Nose, Throat:  No change in hearing or congestion Skin: No rash  or itching Cardiovascular: No chest pain, chest pressure or palpitations   Respiratory: No SOB or cough Gastrointestinal: See HPI and otherwise negative Genitourinary: No dysuria or change in urinary frequency Neurological: No headache, dizziness or syncope Musculoskeletal: No new muscle or joint pain Hematologic: No bleeding or bruising    Physical Exam:  Vital signs: There were no vitals taken for this visit.  Constitutional: NAD, Well developed, Well nourished, alert and cooperative Head:  Normocephalic and atraumatic.  Eyes: No scleral icterus. Conjunctiva pink. Mouth: No oral lesions. Respiratory: Respirations even and unlabored. Lungs clear to auscultation bilaterally.  No wheezes, crackles, or rhonchi.  Cardiovascular:  Regular rate and rhythm. No murmurs. No peripheral  edema. Gastrointestinal:  Soft, nondistended, nontender. No rebound or guarding. Normal bowel sounds. No appreciable masses or hepatomegaly. Rectal:  Not performed.  Neurologic:  Alert and oriented x4;  grossly normal neurologically.  Skin:   Dry and intact without significant lesions or rashes. Psychiatric: Oriented to person, place and time. Demonstrates good judgement and reason without abnormal affect or behaviors.   RELEVANT LABS AND IMAGING: CBC    Component Value Date/Time   WBC 4.2 04/08/2024 1147   WBC 13.4 (H) 04/21/2019 0300   RBC 4.33 04/08/2024 1147   RBC 4.01 04/21/2019 0300   HGB 13.9 04/08/2024 1147   HCT 43.5 04/08/2024 1147   PLT 242 04/08/2024 1147   MCV 101 (H) 04/08/2024 1147   MCH 32.1 04/08/2024 1147   MCH 30.7 04/21/2019 0300   MCHC 32.0 04/08/2024 1147   MCHC 33.2 04/21/2019 0300   RDW 11.8 04/08/2024 1147   LYMPHSABS 1.2 04/08/2024 1147   MONOABS 0.3 06/21/2017 2025   EOSABS 0.1 04/08/2024 1147   BASOSABS 0.0 04/08/2024 1147    CMP     Component Value Date/Time   NA 139 04/08/2024 1147   K 4.2 04/08/2024 1147   CL 106 04/08/2024 1147   CO2 18 (L) 04/08/2024 1147   GLUCOSE 91 04/08/2024 1147   GLUCOSE 98 06/21/2017 2050   BUN 10 04/08/2024 1147   CREATININE 0.74 04/08/2024 1147   CALCIUM 9.3 04/08/2024 1147   PROT 6.7 04/08/2024 1147   ALBUMIN 4.3 04/08/2024 1147   AST 15 04/08/2024 1147   ALT 16 04/08/2024 1147   ALKPHOS 35 (L) 04/08/2024 1147   BILITOT 0.5 04/08/2024 1147   GFRNONAA 115 09/26/2018 1003   GFRAA 133 09/26/2018 1003     Assessment/Plan:       Camie Furbish, PA-C [facility] 05/25/2024, 10:05 PM  Patient Care Team: Gayle Saddie JULIANNA DEVONNA as PCP - General (Physician Assistant) Jenel Carlin POUR, MD (Inactive) as Consulting Physician (Neurology) Copland, Alicia B, PA-C as Referring Physician (Obstetrics and Gynecology)

## 2024-05-26 ENCOUNTER — Other Ambulatory Visit (INDEPENDENT_AMBULATORY_CARE_PROVIDER_SITE_OTHER)

## 2024-05-26 ENCOUNTER — Ambulatory Visit: Admitting: Gastroenterology

## 2024-05-26 ENCOUNTER — Encounter: Payer: Self-pay | Admitting: Gastroenterology

## 2024-05-26 VITALS — BP 110/60 | HR 76 | Ht 64.0 in | Wt 161.0 lb

## 2024-05-26 DIAGNOSIS — R140 Abdominal distension (gaseous): Secondary | ICD-10-CM

## 2024-05-26 DIAGNOSIS — R195 Other fecal abnormalities: Secondary | ICD-10-CM

## 2024-05-26 DIAGNOSIS — R194 Change in bowel habit: Secondary | ICD-10-CM

## 2024-05-26 DIAGNOSIS — R1010 Upper abdominal pain, unspecified: Secondary | ICD-10-CM | POA: Diagnosis not present

## 2024-05-26 DIAGNOSIS — M533 Sacrococcygeal disorders, not elsewhere classified: Secondary | ICD-10-CM

## 2024-05-26 DIAGNOSIS — Z800 Family history of malignant neoplasm of digestive organs: Secondary | ICD-10-CM

## 2024-05-26 MED ORDER — NA SULFATE-K SULFATE-MG SULF 17.5-3.13-1.6 GM/177ML PO SOLN: 1.0000 | Freq: Once | ORAL | 0 refills | Status: AC

## 2024-05-26 NOTE — Patient Instructions (Signed)
 _______________________________________________________  If your blood pressure at your visit was 140/90 or greater, please contact your primary care physician to follow up on this.  _______________________________________________________  If you are age 39 or older, your body mass index should be between 23-30. Your Body mass index is 27.64 kg/m. If this is out of the aforementioned range listed, please consider follow up with your Primary Care Provider.  If you are age 63 or younger, your body mass index should be between 19-25. Your Body mass index is 27.64 kg/m. If this is out of the aformentioned range listed, please consider follow up with your Primary Care Provider.   ________________________________________________________  The [facility] providers would like to encourage you to use MYCHART to communicate with providers for non-urgent requests or questions.  Due to long hold times on the telephone, sending your provider a message by [facility] may be a faster and more efficient way to get a response.  Please allow 48 business hours for a response.  Please remember that this is for non-urgent requests.  _______________________________________________________  [facility] is using a team-based approach to care.  Your team is made up of your doctor and two to three APPS. Our APPS (Nurse Practitioners and Physician Assistants) work with your physician to ensure care continuity for you. They are fully qualified to address your health concerns and develop a treatment plan. They communicate directly with your gastroenterologist to care for you. Seeing the Advanced Practice Practitioners on your physician's team can help you by facilitating care more promptly, often allowing for earlier appointments, access to diagnostic testing, procedures, and other specialty referrals.   Your provider has requested that you go to the basement level for lab work before leaving today. Press B on the  elevator. The lab is located at the first door on the left as you exit the elevator.  We have sent the following medications to your pharmacy for you to pick up at your convenience: Suprep  You have been scheduled for an abdominal ultrasound at [facility] (1st floor of hospital) on 06-04-24 at 930am. Please arrive 15 minutes prior to your appointment for registration. Make certain not to have anything to eat or drink midnight prior to your appointment. Should you need to reschedule your appointment, please contact radiology at [phone]. This test typically takes about 30 minutes to perform.  Please follow up in 1 month after the procedure. Give us  a call at [phone] to schedule an appointment.  You have been scheduled for a colonoscopy. Please follow written instructions given to you at your visit today.   If you use inhalers (even only as needed), please bring them with you on the day of your procedure.  DO NOT TAKE 7 DAYS PRIOR TO TEST- Trulicity (dulaglutide) Ozempic, Wegovy (semaglutide) Mounjaro (tirzepatide) Bydureon Bcise (exanatide extended release)  DO NOT TAKE 1 DAY PRIOR TO YOUR TEST Rybelsus (semaglutide) Adlyxin (lixisenatide) Victoza (liraglutide) Byetta (exanatide) ___________________________________________________________________________  It was a pleasure to see you today!  Thank you for trusting me with your gastrointestinal care!

## 2024-05-27 ENCOUNTER — Ambulatory Visit: Payer: Self-pay | Admitting: Gastroenterology

## 2024-05-27 ENCOUNTER — Encounter: Payer: Self-pay | Admitting: Gastroenterology

## 2024-05-27 LAB — IGA: Immunoglobulin A: 145 mg/dL (ref 47–310)

## 2024-05-27 LAB — TISSUE TRANSGLUTAMINASE, IGA: (tTG) Ab, IgA: <1 U/mL

## 2024-05-28 NOTE — Progress Notes (Signed)
 ____________________________________________________________  Attending physician addendum:  Thank you for sending this case to me. I have reviewed the entire note and agree with the plan.   Victory Brand, MD  ____________________________________________________________

## 2024-06-02 ENCOUNTER — Other Ambulatory Visit

## 2024-06-02 ENCOUNTER — Telehealth: Payer: Self-pay | Admitting: Gastroenterology

## 2024-06-02 DIAGNOSIS — R1010 Upper abdominal pain, unspecified: Secondary | ICD-10-CM

## 2024-06-02 DIAGNOSIS — R194 Change in bowel habit: Secondary | ICD-10-CM | POA: Diagnosis not present

## 2024-06-02 DIAGNOSIS — M533 Sacrococcygeal disorders, not elsewhere classified: Secondary | ICD-10-CM

## 2024-06-02 DIAGNOSIS — R140 Abdominal distension (gaseous): Secondary | ICD-10-CM | POA: Diagnosis not present

## 2024-06-02 DIAGNOSIS — R195 Other fecal abnormalities: Secondary | ICD-10-CM

## 2024-06-02 DIAGNOSIS — Z800 Family history of malignant neoplasm of digestive organs: Secondary | ICD-10-CM

## 2024-06-02 NOTE — Telephone Encounter (Signed)
 The patient has been notified of this information and all questions answered.   The pt ha completed and will turn in today

## 2024-06-02 NOTE — Telephone Encounter (Signed)
 Please remind patient to return stool studies this week so we can rule out underlying infection and make sure she is still appropriate for procedures in the [facility].

## 2024-06-04 ENCOUNTER — Ambulatory Visit (HOSPITAL_COMMUNITY)
Admission: RE | Admit: 2024-06-04 | Discharge: 2024-06-04 | Disposition: A | Discharge status: Home / Self Care | Source: Ambulatory Visit | Attending: Gastroenterology | Admitting: Gastroenterology

## 2024-06-04 DIAGNOSIS — R109 Unspecified abdominal pain: Secondary | ICD-10-CM | POA: Diagnosis not present

## 2024-06-04 DIAGNOSIS — R194 Change in bowel habit: Secondary | ICD-10-CM | POA: Insufficient documentation

## 2024-06-04 DIAGNOSIS — R140 Abdominal distension (gaseous): Secondary | ICD-10-CM | POA: Diagnosis not present

## 2024-06-04 DIAGNOSIS — R1010 Upper abdominal pain, unspecified: Secondary | ICD-10-CM | POA: Insufficient documentation

## 2024-06-06 LAB — STOOL CULTURE: E coli, Shiga toxin Assay: NEGATIVE

## 2024-06-06 LAB — H. PYLORI ANTIGEN, STOOL: H pylori Ag, Stl: NEGATIVE

## 2024-06-08 LAB — CLOSTRIDIUM DIFFICILE TOXIN B, QUALITATIVE, REAL-TIME PCR: Toxigenic C. Difficile by PCR: NOT DETECTED

## 2024-06-08 LAB — PANCREATIC ELASTASE, FECAL: Pancreatic Elastase-1, Stool: >800 ug/g (ref >200)

## 2024-06-17 ENCOUNTER — Ambulatory Visit: Admitting: Obstetrics & Gynecology

## 2024-06-17 ENCOUNTER — Encounter: Payer: Self-pay | Admitting: Obstetrics & Gynecology

## 2024-06-17 VITALS — BP 128/81 | HR 73 | Ht 64.0 in | Wt 167.0 lb

## 2024-06-17 DIAGNOSIS — Z3009 Encounter for other general counseling and advice on contraception: Secondary | ICD-10-CM | POA: Diagnosis not present

## 2024-06-17 NOTE — Progress Notes (Signed)
 GYNECOLOGY OFFICE VISIT NOTE  History:  Brooke Mclaughlin is a 38 y.o. [id number] here today for sterilization consult.  Was seen by Dr. Erik on 09/24/2023 and they discussed this at that visit. She denies any abnormal vaginal discharge, bleeding, pelvic pain or other concerns.  She has lost over 70 lbs intentionally since that last visit with Dr. Erik!  Past Medical History:  Diagnosis Date   Eczema    Frequent headaches    History of cold sores    No genital lesions   History of Papanicolaou smear of cervix 12/31/13; 05/01/16   NEG; -/-   Irregular menses    Migraines    Palpitations    Seasonal allergies 10/01/2017   Syncope 2003   has felt that way since but did not faint   Urticaria    UTI (lower urinary tract infection)     Past Surgical History:  Procedure Laterality Date   ADENOIDECTOMY     TONSILECTOMY, ADENOIDECTOMY, BILATERAL MYRINGOTOMY AND TUBES Bilateral    TYMPANOSTOMY TUBE PLACEMENT      The following portions of the patient's history were reviewed and updated as appropriate: allergies, current medications, past family history, past medical history, past social history, past surgical history and problem list.   Health Maintenance:  Normal pap and negative HRHPV on 04/10/2022.    Review of Systems:  Pertinent items noted in HPI and remainder of comprehensive ROS otherwise negative.  Physical Exam:  BP 128/81   Pulse 73   Ht 5' 4 (1.626 m)   Wt 167 lb (75.8 kg)   BMI 28.67 kg/m  CONSTITUTIONAL: Well-developed, well-nourished female in no acute distress.  HEENT:  Normocephalic, atraumatic. External right and left ear normal. No scleral icterus.  NECK: Normal range of motion, supple, no masses noted on observation SKIN: No rash noted. Not diaphoretic. No erythema. No pallor. MUSCULOSKELETAL: Normal range of motion. No edema noted. NEUROLOGIC: Alert and oriented to person, place, and time. Normal muscle tone coordination. No cranial nerve deficit noted on  observation. PSYCHIATRIC: Normal mood and affect. Normal behavior. Normal judgment and thought content. CARDIOVASCULAR: Normal heart rate noted RESPIRATORY: Effort and breath sounds normal, no problems with respiration noted ABDOMEN: No masses or other overt distention noted on observation. No tenderness.   PELVIC: Deferred   Assessment and Plan:    1. Consultation for sterilization (Primary) Patient desires permanent sterilization.  Other reversible forms of contraception (over the counter/barrier methods; hormonal contraceptives including pill, patch, ring, Depo-Provera injection, Nexplanon implant; hormonal IUDs (Liletta, Mirena and Kyleena); nonhormonal copper IUD (Paragard) were discussed with patient; she declined all these modalities. Also discussed the option of vasectomy for her female partner; she will discuss this further with her partner. She desires laparoscopic bilateral salpingectomy. She was told that both tubes will be resected via three small incisions; the failure risk is less than 1%.  Any future pregnancies will have to be attempted via IVF or other fertility procedures.  Reiterated permanence and irreversibility of this procedures.  Also emphasized risk of regret which is noted more in patients less than the age of 48.  Other risks of the procedure were discussed with patient including but not limited to: bleeding, infection, injury to surrounding organs and need for additional procedures.  Also discussed possibility of post-tubal syndrome with increased pelvic pain or menstrual irregularities.  All questions were answered.  Patient verbalized understanding of these risks and wants to proceed with this procedure.  She was told that she will  be contacted by our surgical scheduler regarding the time and date of her surgery; routine preoperative instructions of having nothing to eat or drink after midnight on the day prior to surgery and also coming to the hospital about 1.5 hours prior  to her time of surgery were also emphasized.  She was told she may be called for a preoperative appointment about a week prior to surgery and will be given further preoperative instructions at that visit. Printed patient education handouts about the procedure were given to the patient to review at home. - Ambulatory Referral For Surgery Scheduling (sent in for myself and [facility])  Routine preventative health maintenance measures emphasized, congratulated on weight loss success! Please refer to After Visit Summary for other counseling recommendations.   Return for any gynecologic concerns.    I spent 45 minutes dedicated to the care of this patient including pre-visit review of records, face to face time with the patient discussing her conditions and treatments, post visit ordering of medications and appropriate tests or procedures, coordinating care and documenting this visit encounter.    GLORIS HUGGER, MD, FACOG Obstetrician & Gynecologist, [facility] [employer], [facility]

## 2024-06-17 NOTE — Patient Instructions (Signed)
Laparoscopic BilateralTubal Removal/Laparoscopic Bilateral Salpingectomy Laparoscopic tubal removal is a procedure that removes the fallopian tubes at a time other than right after childbirth. By removing the fallopian tubes, the eggs that are released from the ovaries cannot enter the uterus and sperm cannot reach the egg. This is more effective than tubal ligation which is also known as getting your "tubes tied." Tubal removal is done so you will not be able to get pregnant or have a baby.  This procedure is permanent and irreversible. If you want to have future pregnancies, you should not have this procedure.  LET YOUR CAREGIVER KNOW ABOUT: Allergies to food or medicine. Medicines taken, including vitamins, herbs, eyedrops, over-the-counter medicines, and creams. Use of steroids (by mouth or creams). Previous problems with numbing medicines. History of bleeding problems or blood clots. Any recent colds or infections. Previous surgery. Other health problems, including diabetes and kidney problems. Possibility of pregnancy, if this applies. Any past pregnancies. RISKS AND COMPLICATIONS  Infection. Bleeding. Injury to surrounding organs. Anesthetic side effects. Failure of the procedure. Ectopic pregnancy. Future regret about having the procedure done. BEFORE THE PROCEDURE Do not take aspirin or blood thinners a week before the procedure or as directed. This can cause bleeding. Do not eat or drink anything 6 to 8 hours before the procedure. PROCEDURE  You may be given a medicine to help you relax (sedative) before the procedure. You will be given a medicine to make you sleep (general anesthetic) during the procedure. A tube will be put down your throat to help your breath while under general anesthesia. Two small cuts (incisions) are made in the lower abdominal area and one incision is made near the belly button. Your abdominal area will be inflated with a safe gas (carbon dioxide). This  helps give the surgeon room to operate, visualize, and helps the surgeon avoid other organs. A thin, lighted tube (laparoscope) with a camera attached is inserted into your abdomen through the incision near the belly button. Other small instruments are also inserted through the other abdominal incisions. The fallopian tubes are located and are removed. After the fallopian tubes are removed, the gas is released from the abdomen. The incisions will be closed with stitches (sutures), and Dermabond. A bandage may be placed over the incisions. AFTER THE PROCEDURE  You will also have some mild abdominal discomfort for 3-7 days. You will be given pain medicine to ease any discomfort. As long as there are no problems, you may be allowed to go home. Someone will need to drive you home and be with you for at least 24 hours once home. You may have some mild discomfort in the throat. This is from the tube placed in your throat while you were sleeping. You may experience discomfort in the shoulder area from some trapped air between the liver and diaphragm. This sensation is normal and will slowly go away on its own. HOME CARE INSTRUCTIONS  Take all medicines as directed. Only take over-the-counter or prescription medicines for pain, discomfort, or fever as directed by your caregiver. Resume daily activities as directed. Showers are preferred over baths. You may resume sexual activities in 1 week or as directed. Do not drive while taking narcotics. SEEK MEDICAL CARE IF: . There is increasing abdominal pain. You feel lightheaded or faint. You have the chills. You have an oral temperature above 102 F (38.9 C). There is pus-like (purulent) drainage from any of the wounds. You are unable to pass gas or have a   bowel movement. You feel sick to your stomach (nauseous) or throw up (vomit). MAKE SURE YOU:  Understand these instructions. Will watch your condition. Will get help right away if you are not doing  well or get worse.  ExitCare Patient Information 2013 [employer].    

## 2024-07-01 ENCOUNTER — Encounter: Payer: Self-pay | Admitting: Gastroenterology

## 2024-07-08 ENCOUNTER — Ambulatory Visit (AMBULATORY_SURGERY_CENTER): Admitting: Gastroenterology

## 2024-07-08 ENCOUNTER — Encounter: Payer: Self-pay | Admitting: Gastroenterology

## 2024-07-08 VITALS — BP 116/70 | HR 61 | Temp 98.2°F | Resp 17 | Ht 64.0 in | Wt 161.0 lb

## 2024-07-08 DIAGNOSIS — K648 Other hemorrhoids: Secondary | ICD-10-CM | POA: Diagnosis not present

## 2024-07-08 DIAGNOSIS — K529 Noninfective gastroenteritis and colitis, unspecified: Secondary | ICD-10-CM

## 2024-07-08 MED ORDER — SODIUM CHLORIDE 0.9 % IV SOLN: 500.0000 mL | Freq: Once | INTRAVENOUS | Status: DC

## 2024-07-08 NOTE — Patient Instructions (Addendum)
 Handouts given: Hemorrhoids, Lactose-Free Diet Resume previous diet. Continue present medications.  Await pathology results. If negative for microscopic colitis, trial of anti-spasmodic. Try 2 weeks of lactose free diet if not previously done. Repeat colonoscopy in 5 years for screening purposes. (Mother had metastatic CRC age 39.)  YOU HAD AN ENDOSCOPIC PROCEDURE TODAY AT THE [facility]:   Refer to the procedure report that was given to you for any specific questions about what was found during the examination.  If the procedure report does not answer your questions, please call your gastroenterologist to clarify.  If you requested that your care partner not be given the details of your procedure findings, then the procedure report has been included in a sealed envelope for you to review at your convenience later.  YOU SHOULD EXPECT: Some feelings of bloating in the abdomen. Passage of more gas than usual.  Walking can help get rid of the air that was put into your GI tract during the procedure and reduce the bloating. If you had a lower endoscopy (such as a colonoscopy or flexible sigmoidoscopy) you may notice spotting of blood in your stool or on the toilet paper. If you underwent a bowel prep for your procedure, you may not have a normal bowel movement for a few days.  Please Note:  You might notice some irritation and congestion in your nose or some drainage.  This is from the oxygen used during your procedure.  There is no need for concern and it should clear up in a day or so.  SYMPTOMS TO REPORT IMMEDIATELY:  Following lower endoscopy (colonoscopy or flexible sigmoidoscopy):  Excessive amounts of blood in the stool  Significant tenderness or worsening of abdominal pains  Swelling of the abdomen that is new, acute  Fever of 100F or higher  For urgent or emergent issues, a gastroenterologist can be reached at any hour by calling [phone]. Do not use MyChart messaging  for urgent concerns.    DIET:  We do recommend a small meal at first, but then you may proceed to your regular diet.  Drink plenty of fluids but you should avoid alcoholic beverages for 24 hours.  ACTIVITY:  You should plan to take it easy for the rest of today and you should NOT DRIVE or use heavy machinery until tomorrow (because of the sedation medicines used during the test).    FOLLOW UP: Our staff will call the number listed on your records the next business day following your procedure.  We will call around 7:15- 8:00 am to check on you and address any questions or concerns that you may have regarding the information given to you following your procedure. If we do not reach you, we will leave a message.     If any biopsies were taken you will be contacted by phone or by letter within the next 1-3 weeks.  Please call us  at [phone] if you have not heard about the biopsies in 3 weeks.    SIGNATURES/CONFIDENTIALITY: You and/or your care partner have signed paperwork which will be entered into your electronic medical record.  These signatures attest to the fact that that the information above on your After Visit Summary has been reviewed and is understood.  Full responsibility of the confidentiality of this discharge information lies with you and/or your care-partner.

## 2024-07-08 NOTE — Progress Notes (Signed)
 History and Physical:  This patient presents for endoscopic testing for: Encounter Diagnosis  Name Primary?   Chronic diarrhea Yes    38 year old woman here for colonoscopy to evaluate chronic diarrhea.  She was evaluated in the office on 05/26/2024, and clinical details can be found in that APP office note. Subsequent stool studies were negative for multiple infectious pathogens, H. pylori, normal fecal elastase.  Blood work with normal celiac antibody   Patient also has a history of apparent who developed metastatic colorectal cancer in their 65s.  Patient is otherwise without complaints or active issues today.   Past Medical History: Past Medical History:  Diagnosis Date   Allergy    Eczema    Frequent headaches    History of cold sores    No genital lesions   History of Papanicolaou smear of cervix 12/31/13; 05/01/16   NEG; -/-   Irregular menses    Migraines    Palpitations    Seasonal allergies 10/01/2017   Syncope 2003   has felt that way since but did not faint   Urticaria    UTI (lower urinary tract infection)      Past Surgical History: Past Surgical History:  Procedure Laterality Date   ADENOIDECTOMY     TONSILECTOMY, ADENOIDECTOMY, BILATERAL MYRINGOTOMY AND TUBES Bilateral    TYMPANOSTOMY TUBE PLACEMENT      Allergies: No Known Allergies  Outpatient Meds: Current Outpatient Medications  Medication Sig Dispense Refill   Norethindrone -Ethinyl Estradiol-Fe Biphas (LO LOESTRIN FE ) 1 MG-10 MCG / 10 MCG tablet Take 1 tablet by mouth daily. 84 tablet 3   acetaminophen  (TYLENOL ) 325 MG tablet Take 2 tablets (650 mg total) by mouth every 4 (four) hours as needed (for pain scale < 4).     fexofenadine (ALLEGRA) 60 MG tablet Take 60 mg by mouth 2 (two) times daily.     loratadine (CLARITIN) 10 MG tablet Take 10 mg by mouth daily as needed for allergies. Alternates between Allegra and Claritin.     valACYclovir  (VALTREX ) 1000 MG tablet Take 2 tablets (2,000 mg  total) by mouth as needed. 30 tablet 2   Current Facility-Administered Medications  Medication Dose Route Frequency Provider Last Rate Last Admin   0.9 %  sodium chloride  infusion  500 mL Intravenous Once Danis, Reice Bienvenue L III, MD       Facility-Administered Medications Ordered in Other Visits  Medication Dose Route Frequency Provider Last Rate Last Admin   gadopentetate dimeglumine  (MAGNEVIST ) injection 19 mL  19 mL Intravenous Once PRN Ines Onetha NOVAK, MD          ___________________________________________________________________ Objective   Exam:  BP 118/75   Pulse 60   Temp 98.2 F (36.8 C) (Temporal)   Resp 14   Ht 5' 4 (1.626 m)   Wt 161 lb (73 kg)   SpO2 100%   BMI 27.64 kg/m   CV: regular , S1/S2 Resp: clear to auscultation bilaterally, normal RR and effort noted GI: soft, no tenderness, with active bowel sounds.   Assessment: Encounter Diagnosis  Name Primary?   Chronic diarrhea Yes     Plan: Colonoscopy   The benefits and risks of the planned procedure(s) were described in detail with the patient or (when appropriate) their health care proxy.  Risks were outlined as including, but not limited to, bleeding, infection, perforation, adverse medication reaction leading to cardiac or pulmonary decompensation, pancreatitis (if ERCP).  The limitation of incomplete mucosal visualization was also discussed.  No guarantees or  warranties were given.  The patient was provided an opportunity to ask questions and all were answered. The patient agreed with the plan.   The patient is appropriate for an endoscopic procedure in the ambulatory setting.   - Victory Brand, MD

## 2024-07-08 NOTE — Op Note (Addendum)
 [facility] Patient Name: Brooke Mclaughlin Procedure Date: 07/08/2024 7:21 AM MRN: [id number] Endoscopist: Victory L. Legrand , MD, [id number] Age: 38 Referring MD:  Date of Birth: [birth year] Gender: Female Account #: [id number] Procedure:                Colonoscopy Indications:              Chronic diarrhea                           clinical details in recent office note                           subsequent normal stool infectious studies, neg H                            pylori stool Ag, normal fecal elastase, normal tTG                            Ab Medicines:                Monitored Anesthesia Care Procedure:                Pre-Anesthesia Assessment:                           - Prior to the procedure, a History and Physical                            was performed, and patient medications and                            allergies were reviewed. The patient's tolerance of                            previous anesthesia was also reviewed. The risks                            and benefits of the procedure and the sedation                            options and risks were discussed with the patient.                            All questions were answered, and informed consent                            was obtained. Prior Anticoagulants: The patient has                            taken no anticoagulant or antiplatelet agents. ASA                            Grade Assessment: II - A patient with mild systemic  disease. After reviewing the risks and benefits,                            the patient was deemed in satisfactory condition to                            undergo the procedure.                           After obtaining informed consent, the colonoscope                            was passed under direct vision. Throughout the                            procedure, the patient's blood pressure, pulse, and                            oxygen saturations were  monitored continuously. The                            CF [id number] #[id number] was introduced through the anus                            and advanced to the the terminal ileum, with                            identification of the appendiceal orifice and IC                            valve. The colonoscopy was somewhat difficult due                            to a redundant colon. Successful completion of the                            procedure was aided by using manual pressure and                            straightening and shortening the scope to obtain                            bowel loop reduction. The patient tolerated the                            procedure well. The quality of the bowel                            preparation was excellent. The terminal ileum,                            ileocecal valve, appendiceal orifice, and rectum  were photographed. Scope In: 8:11:31 AM Scope Out: 8:27:35 AM Scope Withdrawal Time: 0 hours 10 minutes 17 seconds  Total Procedure Duration: 0 hours 16 minutes 4 seconds  Findings:                 The perianal and digital rectal examinations were                            normal.                           The terminal ileum appeared normal. (intubated 10cm)                           Repeat examination of right colon under NBI                            performed.                           Normal mucosa was found in the entire colon.                            Biopsies for histology were taken with a cold                            forceps from the ascending colon, transverse colon                            and sigmoid colon for evaluation of microscopic                            colitis.                           Internal hemorrhoids were found. The hemorrhoids                            were small.                           The exam was otherwise without abnormality on                            direct and  retroflexion views. Complications:            No immediate complications. Estimated Blood Loss:     Estimated blood loss was minimal. Impression:               - The examined portion of the ileum was normal.                           - Normal mucosa in the entire examined colon.                            Biopsied.                           -  Internal hemorrhoids.                           - The examination was otherwise normal on direct                            and retroflexion views. Recommendation:           - Patient has a contact number available for                            emergencies. The signs and symptoms of potential                            delayed complications were discussed with the                            patient. Return to normal activities tomorrow.                            Written discharge instructions were provided to the                            patient.                           - Resume previous diet.                           - Continue present medications.                           - Await pathology results. If negative for                            microscopic colitis, trial of anti-spasmodic.                           - Try 2 weeks of lactose free diet if not                            previously done                           - Repeat colonoscopy in 5 years for screening                            purposes. (mother had metastatic CRC age 12) Rebbeca Sheperd L. Legrand, MD 07/08/2024 8:35:45 AM This report has been signed electronically.

## 2024-07-08 NOTE — Progress Notes (Signed)
 Called to room to assist during endoscopic procedure.  Patient ID and intended procedure confirmed with present staff. Received instructions for my participation in the procedure from the performing physician.

## 2024-07-08 NOTE — Progress Notes (Signed)
 To pacu, VSS. Report to [email or web address]

## 2024-07-09 ENCOUNTER — Telehealth: Payer: Self-pay

## 2024-07-09 NOTE — Telephone Encounter (Signed)
  Follow up Call-     07/08/2024    7:26 AM  Call back number  Post procedure Call Back phone  # [phone]  Permission to leave phone message Yes     Patient questions:  Do you have a fever, pain , or abdominal swelling? No. Pain Score  0 *  Have you tolerated food without any problems? Yes.    Have you been able to return to your normal activities? Yes.    Do you have any questions about your discharge instructions: Diet   No. Medications  No. Follow up visit  No.  Do you have questions or concerns about your Care? No.  Actions: * If pain score is 4 or above: No action needed, pain <4.

## 2024-07-10 LAB — SURGICAL PATHOLOGY

## 2024-07-11 ENCOUNTER — Ambulatory Visit: Payer: Self-pay | Admitting: Gastroenterology

## 2024-07-11 MED ORDER — HYOSCYAMINE SULFATE 0.125 MG SL SUBL
0.1250 mg | SUBLINGUAL_TABLET | Freq: Two times a day (BID) | SUBLINGUAL | 2 refills | Status: AC | PRN
Start: 2024-07-11 — End: ?

## 2024-08-26 ENCOUNTER — Ambulatory Visit: Admitting: Gastroenterology

## 2025-04-01 ENCOUNTER — Other Ambulatory Visit

## 2025-04-08 ENCOUNTER — Encounter
